# Patient Record
Sex: Female | Born: 1975 | Race: White | Hispanic: No | Marital: Married | Smoking: Former smoker
Health system: Southern US, Community
[De-identification: ages and names within clinical notes are randomized; demographics above are authoritative.]

## PROBLEM LIST (undated history)

## (undated) DIAGNOSIS — R011 Cardiac murmur, unspecified: Secondary | ICD-10-CM

## (undated) DIAGNOSIS — I10 Essential (primary) hypertension: Secondary | ICD-10-CM

## (undated) DIAGNOSIS — Z789 Other specified health status: Secondary | ICD-10-CM

## (undated) DIAGNOSIS — I350 Nonrheumatic aortic (valve) stenosis: Secondary | ICD-10-CM

## (undated) HISTORY — PX: KNEE SURGERY: SHX244

---
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
## 2006-09-25 ENCOUNTER — Emergency Department (HOSPITAL_COMMUNITY): Admission: EM | Admit: 2006-09-25 | Discharge: 2006-09-25 | Payer: Self-pay | Admitting: Emergency Medicine

## 2008-07-25 ENCOUNTER — Emergency Department (HOSPITAL_COMMUNITY): Admission: EM | Admit: 2008-07-25 | Discharge: 2008-07-25 | Payer: Self-pay | Admitting: Emergency Medicine

## 2008-11-03 ENCOUNTER — Emergency Department (HOSPITAL_COMMUNITY): Admission: EM | Admit: 2008-11-03 | Discharge: 2008-11-04 | Payer: Self-pay | Admitting: Emergency Medicine

## 2009-03-20 ENCOUNTER — Emergency Department (HOSPITAL_COMMUNITY): Admission: EM | Admit: 2009-03-20 | Discharge: 2009-03-20 | Payer: Self-pay | Admitting: Emergency Medicine

## 2009-05-02 ENCOUNTER — Emergency Department (HOSPITAL_COMMUNITY): Admission: EM | Admit: 2009-05-02 | Discharge: 2009-05-03 | Payer: Self-pay | Admitting: Emergency Medicine

## 2009-05-24 ENCOUNTER — Emergency Department (HOSPITAL_COMMUNITY): Admission: EM | Admit: 2009-05-24 | Discharge: 2009-05-24 | Payer: Self-pay | Admitting: Emergency Medicine

## 2009-10-27 ENCOUNTER — Emergency Department (HOSPITAL_COMMUNITY): Admission: EM | Admit: 2009-10-27 | Discharge: 2009-10-27 | Payer: Self-pay | Admitting: Emergency Medicine

## 2010-12-13 IMAGING — CR DG LUMBAR SPINE COMPLETE 4+V
5 series · 5 of 5 positions shown · non-contrast
Comparison: None.

CLINICAL DATA: Low back pain.  No known injury.  Urinary frequency.

LUMBAR SPINE - COMPLETE 4+ VIEW

[t l-spine a.p.]
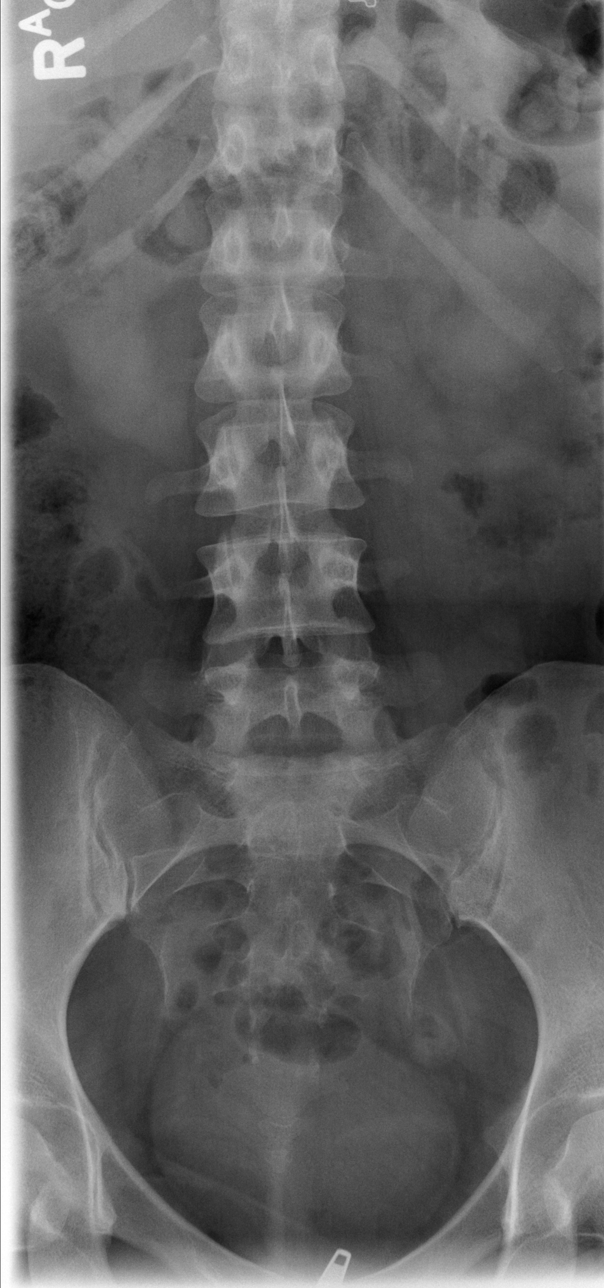

[t l-spine oblique exposure (1 of 2)]
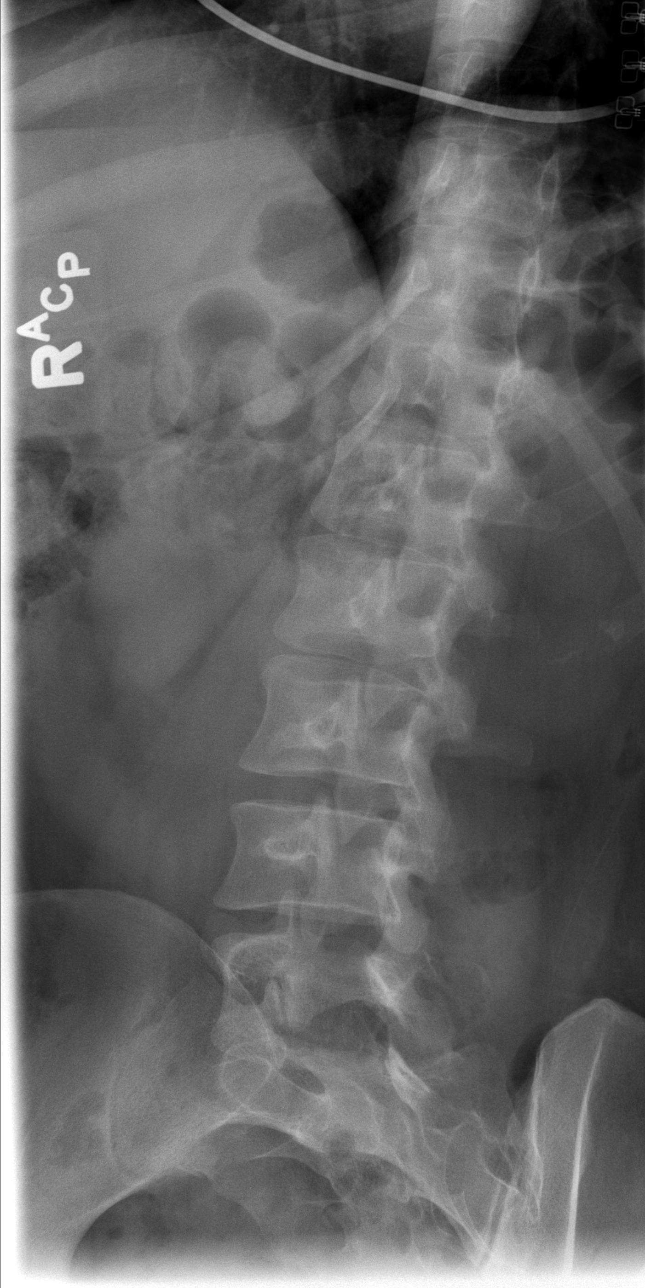

[t l-spine oblique exposure (2 of 2)]
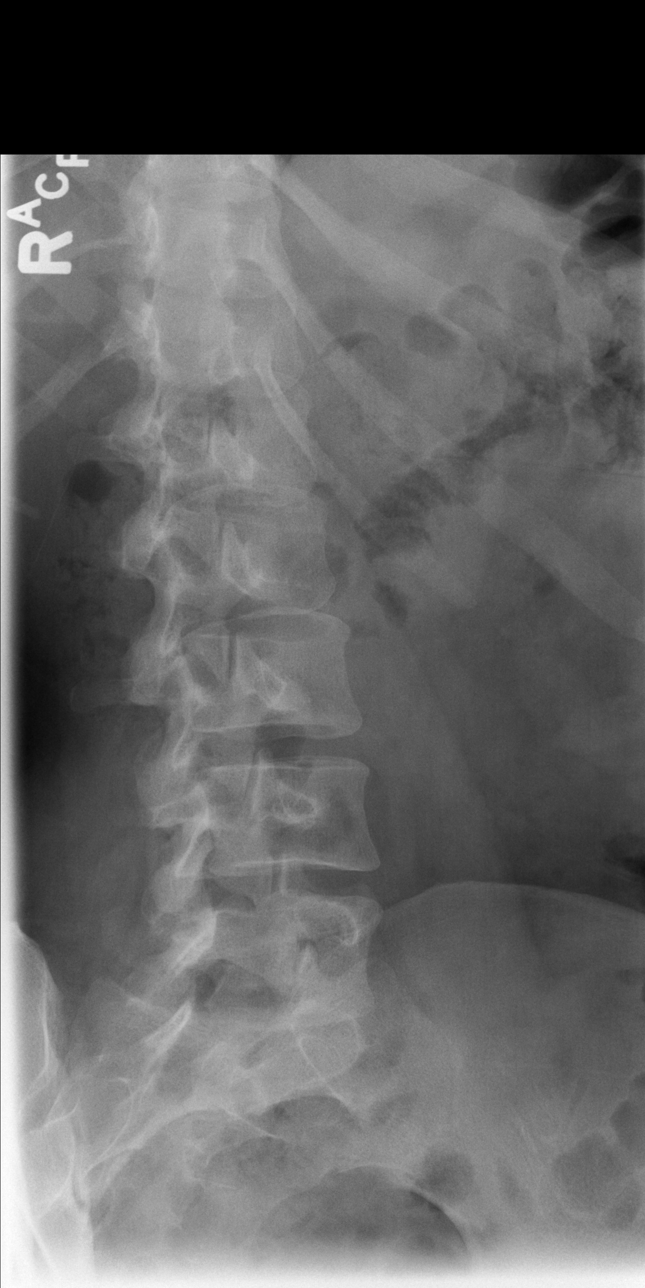

[t l-spine lat]
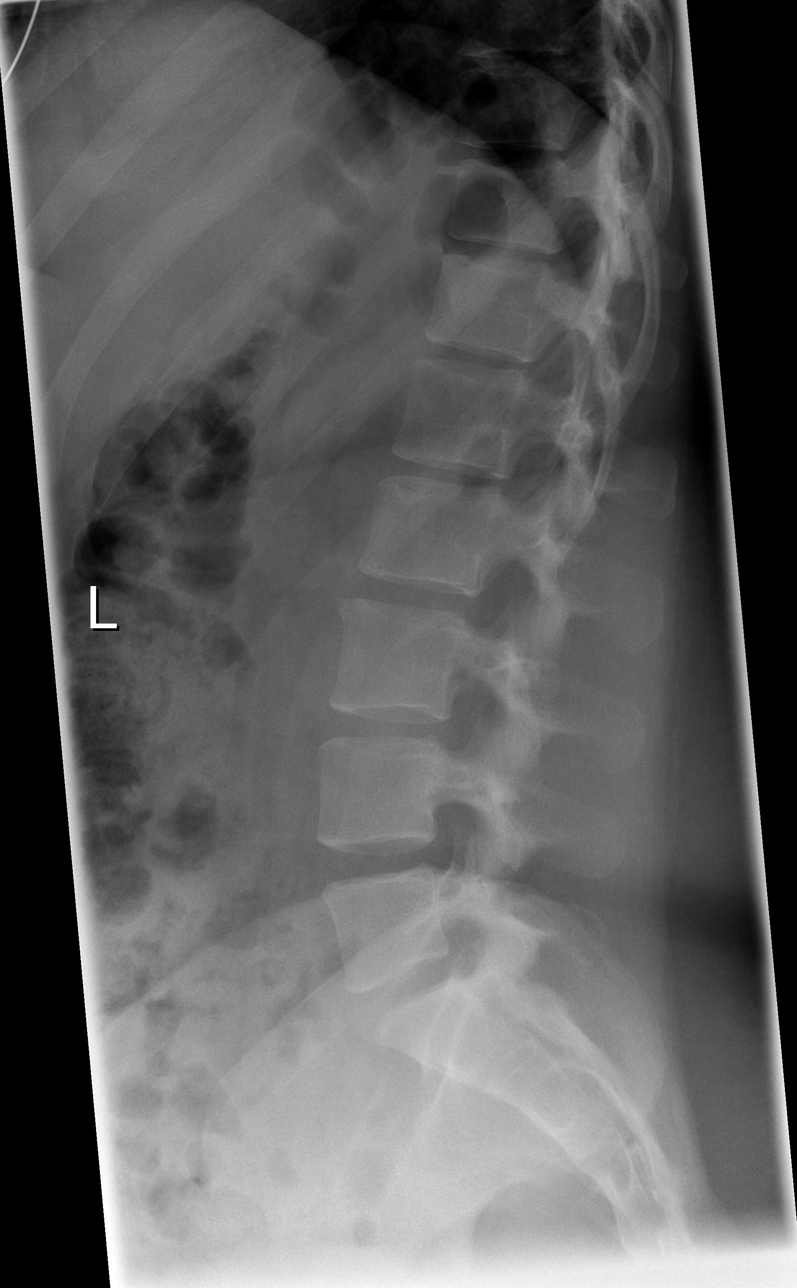

[t l-spine l5-s1 spot]
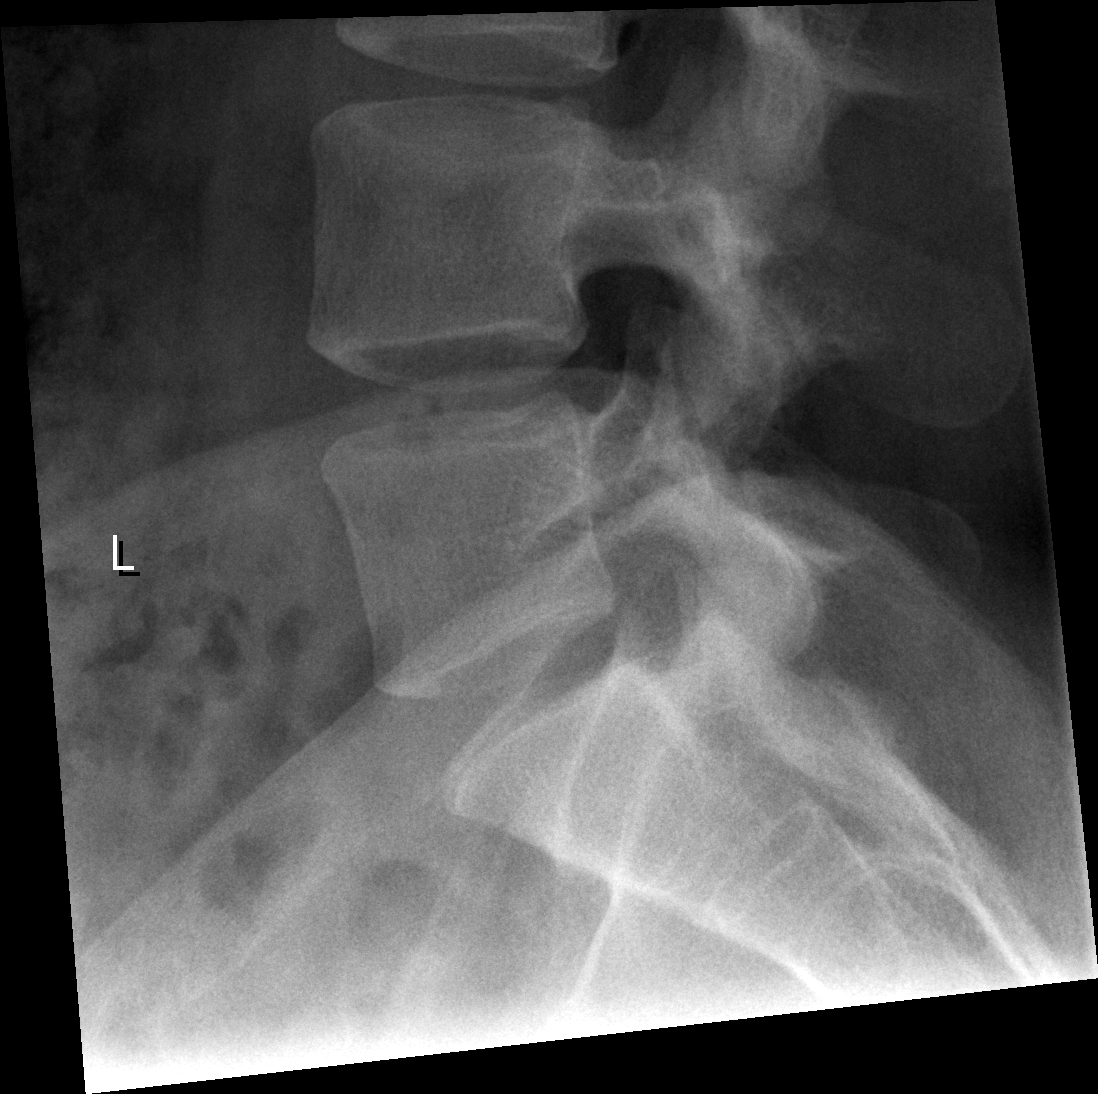

[5 of 5 positions shown; findings below may reference images not displayed]

FINDINGS: Five non-rib bearing lumbar vertebrae.  Minimal
dextroconvex lumbar scoliosis.  Mild anterior spur formation at the
L2-3 level.  No fractures, pars defects or subluxations.
IMPRESSION: Minimal scoliosis and mild degenerative changes.  No acute
abnormality.

## 2011-01-09 LAB — URINALYSIS, ROUTINE W REFLEX MICROSCOPIC
Nitrite: NEGATIVE
Protein, ur: NEGATIVE mg/dL
Specific Gravity, Urine: 1.024 (ref 1.005–1.030)
Urobilinogen, UA: 0.2 mg/dL (ref 0.0–1.0)

## 2011-01-09 LAB — POCT PREGNANCY, URINE

## 2011-01-30 LAB — URINALYSIS, ROUTINE W REFLEX MICROSCOPIC
Bilirubin Urine: NEGATIVE
Ketones, ur: NEGATIVE mg/dL
Nitrite: NEGATIVE
Protein, ur: NEGATIVE mg/dL
pH: 6.5 (ref 5.0–8.0)

## 2011-02-07 LAB — URINALYSIS, ROUTINE W REFLEX MICROSCOPIC
Glucose, UA: NEGATIVE mg/dL
Hgb urine dipstick: NEGATIVE
Ketones, ur: 15 mg/dL — AB
Nitrite: NEGATIVE
Protein, ur: NEGATIVE mg/dL
Specific Gravity, Urine: 1.029 (ref 1.005–1.030)
Urobilinogen, UA: 1 mg/dL (ref 0.0–1.0)
pH: 6 (ref 5.0–8.0)

## 2011-02-07 LAB — URINE MICROSCOPIC-ADD ON

## 2011-02-07 LAB — WET PREP, GENITAL
Trich, Wet Prep: NONE SEEN
Yeast Wet Prep HPF POC: NONE SEEN

## 2011-02-07 LAB — POCT PREGNANCY, URINE

## 2011-02-07 LAB — HCG, QUANTITATIVE, PREGNANCY

## 2011-02-07 LAB — GC/CHLAMYDIA PROBE AMP, GENITAL

## 2011-02-07 LAB — ABO/RH: ABO/RH(D): A POS

## 2011-07-26 LAB — RPR: RPR Ser Ql: NONREACTIVE

## 2011-07-26 LAB — URINALYSIS, ROUTINE W REFLEX MICROSCOPIC
Bilirubin Urine: NEGATIVE
Glucose, UA: NEGATIVE
Hgb urine dipstick: NEGATIVE
Ketones, ur: NEGATIVE
Nitrite: NEGATIVE
Specific Gravity, Urine: 1.01
pH: 6

## 2011-07-26 LAB — DIFFERENTIAL
Eosinophils Relative: 1
Lymphocytes Relative: 32
Lymphs Abs: 3.6
Monocytes Absolute: 0.5
Monocytes Relative: 4
Neutro Abs: 7

## 2011-07-26 LAB — CBC
HCT: 45.9
Hemoglobin: 15.7 — ABNORMAL HIGH
RBC: 5.15 — ABNORMAL HIGH

## 2011-07-26 LAB — POCT I-STAT, CHEM 8
Glucose, Bld: 95
HCT: 47 — ABNORMAL HIGH
Hemoglobin: 16 — ABNORMAL HIGH
Potassium: 3.8
Sodium: 141

## 2011-07-26 LAB — WET PREP, GENITAL
Clue Cells Wet Prep HPF POC: NONE SEEN
Yeast Wet Prep HPF POC: NONE SEEN

## 2011-07-26 LAB — GC/CHLAMYDIA PROBE AMP, GENITAL

## 2012-01-28 HISTORY — PX: TUBAL LIGATION: SHX77

## 2022-11-01 ENCOUNTER — Other Ambulatory Visit: Payer: Self-pay

## 2022-11-01 ENCOUNTER — Encounter (HOSPITAL_COMMUNITY): Payer: Self-pay

## 2022-11-01 ENCOUNTER — Emergency Department (HOSPITAL_COMMUNITY)
Admission: EM | Admit: 2022-11-01 | Discharge: 2022-11-01 | Disposition: A | Discharge status: Home / Self Care | Payer: Commercial Managed Care - PPO | Attending: Emergency Medicine | Admitting: Emergency Medicine

## 2022-11-01 ENCOUNTER — Emergency Department (HOSPITAL_COMMUNITY): Payer: Commercial Managed Care - PPO

## 2022-11-01 DIAGNOSIS — Z8616 Personal history of COVID-19: Secondary | ICD-10-CM | POA: Diagnosis not present

## 2022-11-01 DIAGNOSIS — R55 Syncope and collapse: Secondary | ICD-10-CM | POA: Diagnosis not present

## 2022-11-01 DIAGNOSIS — R011 Cardiac murmur, unspecified: Secondary | ICD-10-CM | POA: Diagnosis not present

## 2022-11-01 HISTORY — DX: Other specified health status: Z78.9

## 2022-11-01 LAB — URINALYSIS, ROUTINE W REFLEX MICROSCOPIC
Bilirubin Urine: NEGATIVE
Glucose, UA: NEGATIVE mg/dL
Ketones, ur: NEGATIVE mg/dL
Nitrite: NEGATIVE
Protein, ur: 100 mg/dL — AB
Specific Gravity, Urine: 1.024 (ref 1.005–1.030)
pH: 7 (ref 5.0–8.0)

## 2022-11-01 LAB — CBC
HCT: 35.6 % — ABNORMAL LOW (ref 36.0–46.0)
Hemoglobin: 12.4 g/dL (ref 12.0–15.0)
MCH: 30 pg (ref 26.0–34.0)
MCHC: 34.8 g/dL (ref 30.0–36.0)
MCV: 86.2 fL (ref 80.0–100.0)
Platelets: 304 10*3/uL (ref 150–400)
RBC: 4.13 MIL/uL (ref 3.87–5.11)
RDW: 12.3 % (ref 11.5–15.5)
WBC: 9.9 10*3/uL (ref 4.0–10.5)
nRBC: 0 % (ref 0.0–0.2)

## 2022-11-01 LAB — COMPREHENSIVE METABOLIC PANEL
ALT: 17 U/L (ref 0–44)
AST: 19 U/L (ref 15–41)
Albumin: 3.1 g/dL — ABNORMAL LOW (ref 3.5–5.0)
Alkaline Phosphatase: 52 U/L (ref 38–126)
Anion gap: 7 (ref 5–15)
BUN: 14 mg/dL (ref 6–20)
CO2: 25 mmol/L (ref 22–32)
Calcium: 8.2 mg/dL — ABNORMAL LOW (ref 8.9–10.3)
Chloride: 104 mmol/L (ref 98–111)
Creatinine, Ser: 0.94 mg/dL (ref 0.44–1.00)
GFR, Estimated: >60 mL/min (ref >60)
Glucose, Bld: 116 mg/dL — ABNORMAL HIGH (ref 70–99)
Potassium: 3.6 mmol/L (ref 3.5–5.1)
Sodium: 136 mmol/L (ref 135–145)
Total Bilirubin: 0.6 mg/dL (ref 0.3–1.2)
Total Protein: 6 g/dL — ABNORMAL LOW (ref 6.5–8.1)

## 2022-11-01 LAB — PREGNANCY, URINE: Preg Test, Ur: NEGATIVE

## 2022-11-01 LAB — LIPASE, BLOOD: Lipase: 37 U/L (ref 11–51)

## 2022-11-01 MED ORDER — LACTATED RINGERS IV BOLUS
1000.0000 mL | Freq: Once | INTRAVENOUS | Status: AC
  Administered 2022-11-01: 1000 mL via INTRAVENOUS

## 2022-11-01 NOTE — ED Triage Notes (Signed)
"  Went to work and was feeling okay, but suddenly felt hot and dizzy. I asked for water and sat down. Next thing I knew I was hearing them calling EMS and I was diaphoretic and nauseated" per pt   "Bystanders said she slumped with head in hands for a few seconds. She did vomit in route to hospital. We gave zofran 4mg " per EMS

## 2022-11-01 NOTE — ED Provider Notes (Signed)
[facility] EMERGENCY DEPARTMENT Provider Note   CSN: [id number] Arrival date & time: 11/01/22  1715     History  Chief Complaint  Patient presents with   Loss of Consciousness    Brianna Turner is a 47 y.o. female.  Is a 47 year old female with no significant past medical history presenting to the emergency department with syncope.  The patient states that she was in her normal state of health today when she went to work this evening.  She states that shortly after arriving to work she became very hot and sweaty and felt lightheaded.  She states that she went to lean over on a counter and next thing she knew she woke up on the ground.  She states that she does not believe she hit her head and denies any pain or injuries from the syncopal event.  She denied any chest pain or shortness of breath prior to passing out but states that she did vomit again after passing out.  She denies any fevers or chills, cough or congestion, diarrhea.  She states that she has been eating and drinking normally.  Of note she states that she did test positive for COVID 2 weeks ago and was told at urgent care that she had a heart murmur that she has not yet had evaluated.  She states that her COVID symptoms have now resolved.  The history is provided by the patient.  Loss of Consciousness      Home Medications Prior to Admission medications   Not on File      Allergies    Naproxen    Review of Systems   Review of Systems  Cardiovascular:  Positive for syncope.    Physical Exam Updated Vital Signs BP 103/72 (BP Location: Right Arm)   Pulse 80   Temp 97.9 F (36.6 C) (Oral)   Resp 16   Ht 5\' 5"  (1.651 m)   Wt 97.5 kg   SpO2 96%   BMI 35.78 kg/m  Physical Exam Vitals and nursing note reviewed.  Constitutional:      General: She is not in acute distress.    Appearance: Normal appearance.  HENT:     Head: Normocephalic and atraumatic.     Nose: Nose normal.      Mouth/Throat:     Mouth: Mucous membranes are moist.     Pharynx: Oropharynx is clear.  Eyes:     Extraocular Movements: Extraocular movements intact.     Conjunctiva/sclera: Conjunctivae normal.     Pupils: Pupils are equal, round, and reactive to light.  Cardiovascular:     Rate and Rhythm: Normal rate and regular rhythm.     Heart sounds: Murmur (systolic) heard.  Pulmonary:     Effort: Pulmonary effort is normal.     Breath sounds: Normal breath sounds.  Abdominal:     General: Abdomen is flat.     Palpations: Abdomen is soft.     Tenderness: There is no abdominal tenderness.  Musculoskeletal:        General: Normal range of motion.     Cervical back: Normal range of motion and neck supple.     Right lower leg: No edema.     Left lower leg: No edema.  Skin:    General: Skin is warm and dry.  Neurological:     General: No focal deficit present.     Mental Status: She is alert and oriented to person, place, and time.  Cranial Nerves: No cranial nerve deficit.     Sensory: No sensory deficit.     Motor: No weakness.  Psychiatric:        Mood and Affect: Mood normal.        Behavior: Behavior normal.     ED Results / Procedures / Treatments   Labs (all labs ordered are listed, but only abnormal results are displayed) Labs Reviewed  CBC - Abnormal; Notable for the following components:      Result Value   HCT 35.6 (*)    All other components within normal limits  URINALYSIS, ROUTINE W REFLEX MICROSCOPIC - Abnormal; Notable for the following components:   APPearance HAZY (*)    Hgb urine dipstick MODERATE (*)    Protein, ur 100 (*)    Leukocytes,Ua TRACE (*)    Bacteria, UA FEW (*)    All other components within normal limits  COMPREHENSIVE METABOLIC PANEL - Abnormal; Notable for the following components:   Glucose, Bld 116 (*)    Calcium 8.2 (*)    Total Protein 6.0 (*)    Albumin 3.1 (*)    All other components within normal limits  PREGNANCY, URINE   LIPASE, BLOOD    EKG EKG Interpretation  Date/Time:  Tuesday November 01 2022 17:47:58 EST Ventricular Rate:  77 PR Interval:  190 QRS Duration: 83 QT Interval:  408 QTC Calculation: 462 R Axis:   16 Text Interpretation: Sinus rhythm Prominent P waves, nondiagnostic Nonspecific T abnormalities, lateral leads No previous ECGs available Confirmed by Leanord Asal ([id number]) on 11/01/2022 6:30:07 PM  Radiology DG Chest 1 View  Result Date: 11/01/2022 CLINICAL DATA:  Syncope EXAM: CHEST  1 VIEW COMPARISON:  None Available. FINDINGS: There is likely some mild atherosclerotic calcification of the aortic arch. Cardiac and mediastinal margins appear normal. The lungs appear clear. No blunting of the costophrenic angles. IMPRESSION: 1. No acute findings. 2. Mild atherosclerotic calcification of the aortic arch. Electronically Signed   By: Van Clines M.D.   On: 11/01/2022 19:51    Procedures Procedures    Medications Ordered in ED Medications  lactated ringers bolus 1,000 mL (0 mLs Intravenous Stopped 11/01/22 2058)    ED Course/ Medical Decision Making/ A&P Clinical Course as of 11/01/22 2123  Tue Nov 01, 2022  2036 Few bacteria and leuks in urine however does have squams and no urinary symptoms making UTI unlikely. Remainder of work up reassuring. Cardiology will be consulted for recommendations on her new murmur in the setting of syncope. [VK]  2046 I spoke with Dr. Einar Gip of cardiology who states the patient is appropriate for discharge with outpatient follow up. He will assist with setting her up and outpatient visit. [VK]    Clinical Course User Index [VK] Kemper Durie, DO                           Medical Decision Making This patient presents to the ED with chief complaint(s) of syncope with no pertinent past medical history which further complicates the presenting complaint. The complaint involves an extensive differential diagnosis and also carries with it a high  risk of complications and morbidity.    The differential diagnosis includes ACS, arrhythmia, aortic stenosis, dehydration, electrolyte abnormality, pregnancy, orthostatic hypotension, vasovagal syncope, patient has no headache or focal neurologic deficits making ICH or mass effect unlikely  Additional history obtained: Additional history obtained from N/A Records reviewed urgent care records  ED  Course and Reassessment: Upon patient's arrival to the emergency department she had soft BPs but was otherwise hemodynamically stable.  She will be started on a liter of fluids and will have labs performed.  EKG on arrival showed normal sinus rhythm without acute ischemic changes.  She will additionally have urine performed to evaluate for possible pregnancy as a cause of her syncope.  She does have a new murmur and cardiology will likely be consulted for recommendations on follow-up and disposition.  Independent labs interpretation:  The following labs were independently interpreted: within normal range  Independent visualization of imaging: - I independently visualized the following imaging with scope of interpretation limited to determining acute life threatening conditions related to emergency care: CXR, which revealed no acute disease  Consultation: - Consulted or discussed management/test interpretation w/ external professional: cardiology  Consideration for admission or further workup: Patient has no emergent conditions requiring admission or further work-up at this time and is stable for discharge home with cardiology follow-up  Social Determinants of health: N/A    Amount and/or Complexity of Data Reviewed Labs: ordered. Radiology: ordered.          Final Clinical Impression(s) / ED Diagnoses Final diagnoses:  Syncope, unspecified syncope type  Heart murmur, systolic    Rx / DC Orders ED Discharge Orders          Ordered    Ambulatory referral to Cardiology        Comments: If you have not heard from the Cardiology office within the next 72 hours please call [phone].   11/01/22 2121              Kemper Durie, DO 11/01/22 2123

## 2022-11-01 NOTE — Discharge Instructions (Signed)
You were seen in the emergency department for your episode of passing out.  You had no signs of abnormal heart rhythm and did not appear severely dehydrated on your workup here today.  You do have a murmur and sometimes that can be indicative of heart disease that could make you pass out, so you should follow-up with cardiology to have this further evaluated.  They should be calling you with an appointment, however if you do not hear from them in the next 2 days I would give them a call to determine when your follow-up appointment is.  You should return to the emergency department if you are having recurrent episodes of passing out, severe chest pain or shortness of breath prior to passing out, you injure yourself after passing out or if you have any other new or concerning symptoms.

## 2022-11-10 DIAGNOSIS — R55 Syncope and collapse: Secondary | ICD-10-CM | POA: Insufficient documentation

## 2022-11-10 NOTE — Progress Notes (Deleted)
    Patient referred by No ref. provider found for syncope  Subjective:   Brianna Turner, female    DOB: 1976-01-14, 47 y.o.   MRN: [id number]  *** No chief complaint on file.   *** HPI  47 y.o. *** female with ***  ***The patient states that she was in her normal state of health today when she went to work this evening. She states that shortly after arriving to work she became very hot and sweaty and felt lightheaded. She states that she went to lean over on a counter and next thing she knew she woke up on the ground. She states that she does not believe she hit her head and denies any pain or injuries from the syncopal event. She denied any chest pain or shortness of breath prior to passing out but states that she did vomit again after passing out. She denies any fevers or chills, cough or congestion, diarrhea. She states that she has been eating and drinking normally. Of note she states that she did test positive for COVID 2 weeks ago and was told at urgent care that she had a heart murmur that she has not yet had evaluated. She states that her COVID symptoms have now resolved.   *** Past Medical History:  Diagnosis Date   Known health problems: none     *** Past Surgical History:  Procedure Laterality Date   CESAREAN SECTION      *** Social History   Tobacco Use  Smoking Status Every Day   Packs/day: 0.50   Years: 20.00   Total pack years: 10.00   Types: Cigarettes  Smokeless Tobacco Never    Social History   Substance and Sexual Activity  Alcohol Use Not Currently    *** No family history on file.  *** No current outpatient medications on file.   Cardiovascular and other pertinent studies:  Reviewed external labs and tests, independently interpreted  *** EKG ***/***/202***: ***  ***  *** Recent labs: 11/01/2022: Glucose 116, BUN/Cr 14/0.94. EGFR >60. Na/K 136/3.6. Rest of the CMP normal H/H 12/35. MCV 86. Platelets 304    *** ROS       *** There were no vitals filed for this visit.   There is no height or weight on file to calculate BMI. There were no vitals filed for this visit.  *** Objective:   Physical Exam    ***     Visit diagnoses: No diagnosis found.   No orders of the defined types were placed in this encounter.    Medication changes this visit: There are no discontinued medications.  No orders of the defined types were placed in this encounter.    Assessment & Recommendations:   ***  ***  Thank you for referring the patient to [place]. Please feel free to contact with any questions.   Nigel Mormon, MD Pager: [phone] Office: [phone]

## 2022-11-11 ENCOUNTER — Encounter: Payer: Commercial Managed Care - PPO | Admitting: Cardiology

## 2022-11-18 ENCOUNTER — Encounter: Payer: Self-pay | Admitting: Cardiology

## 2022-11-18 ENCOUNTER — Ambulatory Visit: Payer: Commercial Managed Care - PPO | Admitting: Cardiology

## 2022-11-18 VITALS — BP 126/96 | HR 96 | Resp 16 | Ht 65.0 in | Wt 216.0 lb

## 2022-11-18 DIAGNOSIS — R011 Cardiac murmur, unspecified: Secondary | ICD-10-CM | POA: Insufficient documentation

## 2022-11-18 DIAGNOSIS — R55 Syncope and collapse: Secondary | ICD-10-CM

## 2022-11-18 MED ORDER — NICOTINE 7 MG/24HR TD PT24: 7.0000 mg | MEDICATED_PATCH | Freq: Every day | TRANSDERMAL | 0 refills | Status: DC

## 2022-11-18 NOTE — Progress Notes (Signed)
Subjective:   Brianna Turner, female    DOB: 05/14/1976, 47 y.o.   MRN: [id number]   Chief Complaint  Patient presents with   Loss of Consciousness   New Patient (Initial Visit)         HPI  47 y.o. Caucasian female with syncope  Patient works as an Radio broadcast assistant at [employer].  2 weeks ago, patient went to work, parked her car and ran to her store given that it was raining.  As she entered the building, she felt hot, lightheaded, sat down, followed by complete loss of consciousness for at least 1 minute.  She reportedly saw the CCTV footage and was told that her fingers and toes were turning blue.  Her coworkers called 911, who had asked to get a ED by patient's side.  Before that could happen, she regained consciousness.  She then went to [facility], ER.  She was reportedly mildly hypotensive on arrival there, but was better after IV fluids.  She was found to have murmur on exam.  She was thus referred for cardiology evaluation.  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  She does not recall having been like this in the recent past.  She denies any chest pain, leg edema, orthopnea.  She reports mild dyspnea with climbing up stairs.  Prior to her syncope episode, she did not drink any water regularly.  She does drink 3-4 cups of coffee, which she has now switched partly to decaf.  She rarely drinks alcohol.  She smokes half pack a day for last 30 years.  Patient has family history of cardiac events.  Mother's brother died at age 64, presumed to be heart attack, but no autopsy was performed to patient's knowledge.  Mother sister also had "heart attack" at age 54, but details are not known.  Separately, this brother has had 5 stents, and mother's mother has had pacemaker.  Patient has no siblings, has 2 children aged 89 and 5.   Past Medical History:  Diagnosis Date   Known health problems: none      Past Surgical History:  Procedure  Laterality Date   CESAREAN SECTION     KNEE SURGERY       Social History   Tobacco Use  Smoking Status Every Day   Packs/day: 0.50   Years: 20.00   Total pack years: 10.00   Types: Cigarettes  Smokeless Tobacco Never    Social History   Substance and Sexual Activity  Alcohol Use Not Currently   Comment: rare     Family History  Problem Relation Age of Onset   Heart disease Mother    Heart disease Maternal Grandmother    Heart attack Maternal Grandfather    Heart attack Paternal Grandfather      No current outpatient medications on file.   Cardiovascular and other pertinent studies:  Reviewed external labs and tests, independently interpreted  EKG 11/18/2022: Sinus rhythm 84 bpm  Left ventricular hypertrophy Left atrial enlargement. ST-T changes likely due to LVH   CXR 11/01/2022: 1. No acute findings. 2. Mild atherosclerotic calcification of the aortic arch.     Recent labs: 11/01/2022: Glucose 116, BUN/Cr 14/0.94. EGFR >60. Na/K 136/3.6. Rest of the CMP normal H/H 12/35. MCV 86. Platelets 304   Review of Systems  Cardiovascular:  Positive for dyspnea on exertion and syncope. Negative for chest pain, leg swelling and palpitations.  Vitals:   11/18/22 0831 11/18/22 0841  BP: (!) 126/96   Pulse: 96   Resp: 16   SpO2: 98% 96%     Body mass index is 35.94 kg/m. Filed Weights   11/18/22 0831  Weight: 216 lb (98 kg)     Objective:   Physical Exam Vitals and nursing note reviewed.  Constitutional:      General: She is not in acute distress. Neck:     Vascular: No JVD.  Cardiovascular:     Rate and Rhythm: Normal rate and regular rhythm.     Heart sounds: Murmur heard.     Harsh holosystolic murmur is present with a grade of 4/6 at the upper right sternal border radiating to the neck. Murmur increased with Valsalva  Pulmonary:     Effort: Pulmonary effort is normal.     Breath sounds: Normal breath sounds. No wheezing or  rales.  Musculoskeletal:     Right lower leg: No edema.     Left lower leg: No edema.         Visit diagnoses:   ICD-10-CM   1. Syncope and collapse  R55 EKG 12-Lead    PCV ECHOCARDIOGRAM COMPLETE    PCV CARDIAC STRESS TEST    PCV CAROTID DUPLEX (BILATERAL)    2. Murmur  R01.1 PCV ECHOCARDIOGRAM COMPLETE    PCV CARDIAC STRESS TEST       Orders Placed This Encounter  Procedures   EKG 12-Lead        Assessment & Recommendations:   47 y.o. Caucasian female with syncope  Syncope: Exertional syncope, LVH and strain pattern on EKG without known hypertension, and physical exam with harsh holosystolic murmur worse with Valsalva, all point towards most likely diagnosis of hypertrophic cardiomyopathy.  She has had at least 1 family member with sudden death at age 25, which was previously presumed to be due to heart attack. I recommend echocardiogram. Encourage increased hydration. Added metoprolol succinate 25 mg daily.  I will perform exercise treadmill stress test for risk stratification (to assess for any exercise induced hypotension or arrhythmia) after starting metoprolol succinate.  Depending on echocardiogram confirmation of hypertrophic cardiomyopathy, she will likely need genetic testing.   Nicotine dependence: Tobacco cessation counseling:  - Currently smoking 1/2 packs/day   - Patient was informed of the dangers of tobacco abuse including stroke, cancer, and MI, as well as benefits of tobacco cessation. - Patient is willing to quit at this time. - Approximately 5 mins were spent counseling patient cessation techniques. We discussed various methods to help quit smoking, including deciding on a date to quit, joining a support group, pharmacological agents. Patient would like to use nicotine patch. - I will reassess her progress at the next follow-up visit   Thank you for referring the patient to [place]. Please feel free to contact with any questions.   Nigel Mormon, MD Pager: [phone] Office: [phone]

## 2022-11-22 ENCOUNTER — Ambulatory Visit: Payer: Commercial Managed Care - PPO

## 2022-11-22 DIAGNOSIS — R011 Cardiac murmur, unspecified: Secondary | ICD-10-CM

## 2022-11-22 DIAGNOSIS — R55 Syncope and collapse: Secondary | ICD-10-CM

## 2022-12-01 ENCOUNTER — Ambulatory Visit: Payer: Commercial Managed Care - PPO | Admitting: Cardiology

## 2022-12-01 ENCOUNTER — Encounter: Payer: Self-pay | Admitting: Cardiology

## 2022-12-01 VITALS — BP 110/73 | HR 83 | Resp 16 | Ht 65.0 in | Wt 218.6 lb

## 2022-12-01 DIAGNOSIS — R011 Cardiac murmur, unspecified: Secondary | ICD-10-CM

## 2022-12-01 DIAGNOSIS — R55 Syncope and collapse: Secondary | ICD-10-CM

## 2022-12-01 MED ORDER — METOPROLOL SUCCINATE ER 25 MG PO TB24: 25.0000 mg | ORAL_TABLET | Freq: Every day | ORAL | 2 refills | Status: DC

## 2022-12-01 NOTE — Progress Notes (Signed)
Subjective:   Brianna Turner, female    DOB: [birth year], 47 y.o.   MRN: [id number]   Chief Complaint  Patient presents with   Results    Echocardiogram     HPI  47 y.o. Caucasian female with syncope, suspected HOCM  Patient has not had any recurrent syncope episodes.  She has not started using nicotine patch yet.  It appears that I will not prescribe metoprolol at last visit, although we had discussed it.  Recent echocardiogram results reviewed with the patient, details below.  Initial consultation visit 10/2022: Patient works as an Radio broadcast assistant at [employer].  2 weeks ago, patient went to work, parked her car and ran to her store given that it was raining.  As she entered the building, she felt hot, lightheaded, sat down, followed by complete loss of consciousness for at least 1 minute.  She reportedly saw the CCTV footage and was told that her fingers and toes were turning blue.  Her coworkers called 911, who had asked to get a ED by patient's side.  Before that could happen, she regained consciousness.  She then went to [facility], ER.  She was reportedly mildly hypotensive on arrival there, but was better after IV fluids.  She was found to have murmur on exam.  She was thus referred for cardiology evaluation.  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  She does not recall having been like this in the recent past.  She denies any chest pain, leg edema, orthopnea.  She reports mild dyspnea with climbing up stairs.  Prior to her syncope episode, she did not drink any water regularly.  She does drink 3-4 cups of coffee, which she has now switched partly to decaf.  She rarely drinks alcohol.  She smokes half pack a day for last 30 years.  Patient has family history of cardiac events.  Mother's brother died at age 67, presumed to be heart attack, but no autopsy was performed to patient's knowledge.  Mother sister also had "heart attack" at age 47,  but details are not known.  Separately, this brother has had 5 stents, and mother's mother has had pacemaker.  Patient has no siblings, has 2 children aged 59 and 101.   Current Outpatient Medications:    nicotine (NICODERM CQ - DOSED IN MG/24 HR) 7 mg/24hr patch, Place 1 patch (7 mg total) onto the skin daily. (Patient not taking: Reported on 12/01/2022), Disp: 28 patch, Rfl: 0   Cardiovascular and other pertinent studies:  Reviewed external labs and tests, independently interpreted  Echocardiogram 11/22/2022:  Normal LV systolic function with visual EF 55-60%. Left ventricle cavity  is normal in size. Mild concentric hypertrophy of the left ventricle.  Normal global wall motion. Normal diastolic filling pattern, normal LAP.  Calculated EF 69%.  Structurally normal tricuspid valve.  Mild tricuspid regurgitation. No  evidence of pulmonary hypertension.  No prior available for comparison.  I personally reviewed the echocardiogram.  Endocardial definition is not clear, therefore LVH assessment is limited.  In addition, LV outflow tract gradient is not adequately assessed, but at least 23 mmHg.  EKG 11/18/2022: Sinus rhythm 84 bpm  Left ventricular hypertrophy Left atrial enlargement. ST-T changes likely due to LVH   CXR 11/01/2022: 1. No acute findings. 2. Mild atherosclerotic calcification of the aortic arch.     Recent labs: 11/01/2022: Glucose 116, BUN/Cr 14/0.94. EGFR >60. Na/K 136/3.6. Rest of the CMP normal H/H  12/35. MCV 86. Platelets 304   Review of Systems  Cardiovascular:  Positive for dyspnea on exertion and syncope. Negative for chest pain, leg swelling and palpitations.         Vitals:   12/01/22 1104  BP: 110/73  Pulse: 83  Resp: 16  SpO2: 97%     Body mass index is 36.38 kg/m. Filed Weights   12/01/22 1104  Weight: 218 lb 9.6 oz (99.2 kg)     Objective:   Physical Exam Vitals and nursing note reviewed.  Constitutional:      General: She is not  in acute distress. Neck:     Vascular: No JVD.  Cardiovascular:     Rate and Rhythm: Normal rate and regular rhythm.     Heart sounds: Murmur heard.     Harsh holosystolic murmur is present with a grade of 4/6 at the upper right sternal border radiating to the neck. Murmur increased with Valsalva  Pulmonary:     Effort: Pulmonary effort is normal.     Breath sounds: Normal breath sounds. No wheezing or rales.  Musculoskeletal:     Right lower leg: No edema.     Left lower leg: No edema.          Visit diagnoses:   ICD-10-CM   1. Murmur  R01.1 ECHOCARDIOGRAM COMPLETE    MR CARDIAC MORPHOLOGY W WO CONTRAST    2. Syncope and collapse  R55 ECHOCARDIOGRAM COMPLETE    MR CARDIAC MORPHOLOGY W WO CONTRAST       Orders Placed This Encounter  Procedures   MR CARDIAC MORPHOLOGY W WO CONTRAST   ECHOCARDIOGRAM COMPLETE        Assessment & Recommendations:   47 y.o. Caucasian female with syncope, suspected HOCM  Suspected HCM, syncope: Exertional syncope, LVH and strain pattern on EKG without known hypertension, and physical exam with harsh holosystolic murmur worse with Valsalva, all point towards most likely diagnosis of hypertrophic cardiomyopathy.  She has had at least 1 family member with sudden death at age 27, which was previously presumed to be due to heart attack. Encourage increased hydration. Added metoprolol succinate 25 mg daily.   Patient outpatient echocardiogram likely underestimated LVH as well as LVEF.  My clinical suspicion for HOCM is very high.  I will repeat an echocardiogram with contrast use, as well as obtain cardiac MRI.  Following echocardiogram, I will perform exercise treadmill stress test for risk stratification (to assess for any exercise induced hypotension or arrhythmia) while on starting metoprolol succinate.  Depending on echocardiogram confirmation of hypertrophic cardiomyopathy, she will likely need genetic testing.   Nicotine  dependence: Emphasized importance of nicotine cessation  Thank you for referring the patient to [place]. Please feel free to contact with any questions.   Nigel Mormon, MD Pager: [phone] Office: [phone]

## 2022-12-08 ENCOUNTER — Ambulatory Visit (HOSPITAL_COMMUNITY)
Admission: RE | Admit: 2022-12-08 | Discharge: 2022-12-08 | Disposition: A | Discharge status: Home / Self Care | Payer: Commercial Managed Care - PPO | Source: Ambulatory Visit | Attending: Cardiology | Admitting: Cardiology

## 2022-12-08 DIAGNOSIS — R011 Cardiac murmur, unspecified: Secondary | ICD-10-CM | POA: Insufficient documentation

## 2022-12-08 DIAGNOSIS — R55 Syncope and collapse: Secondary | ICD-10-CM | POA: Diagnosis not present

## 2022-12-08 MED ORDER — PERFLUTREN LIPID MICROSPHERE
1.0000 mL | INTRAVENOUS | Status: AC | PRN
  Administered 2022-12-08: 4 mL via INTRAVENOUS
  Filled 2022-12-08: qty 10

## 2022-12-08 NOTE — Progress Notes (Signed)
Echocardiogram 2D Echocardiogram has been performed.  Fidel Levy 12/08/2022, 3:02 PM

## 2022-12-09 LAB — ECHOCARDIOGRAM COMPLETE
AR max vel: 0.63 cm2
AV Area VTI: 0.57 cm2
AV Area mean vel: 0.55 cm2
AV Mean grad: 88.7 mmHg
AV Peak grad: 138.8 mmHg
Ao pk vel: 5.89 m/s
Area-P 1/2: 4.15 cm2
Calc EF: 64.3 %
S' Lateral: 2.6 cm
Single Plane A2C EF: 64 %
Single Plane A4C EF: 64.9 %

## 2022-12-09 NOTE — Progress Notes (Signed)
Please cancel stress test and arrange for an office visit at next available with me.   Thanks MJP

## 2023-01-12 ENCOUNTER — Encounter: Payer: Self-pay | Admitting: Cardiology

## 2023-01-12 ENCOUNTER — Ambulatory Visit: Payer: Commercial Managed Care - PPO | Admitting: Cardiology

## 2023-01-12 VITALS — BP 109/80 | HR 90 | Ht 65.0 in | Wt 218.0 lb

## 2023-01-12 DIAGNOSIS — I421 Obstructive hypertrophic cardiomyopathy: Secondary | ICD-10-CM

## 2023-01-12 DIAGNOSIS — R55 Syncope and collapse: Secondary | ICD-10-CM

## 2023-01-12 MED ORDER — METOPROLOL SUCCINATE ER 50 MG PO TB24: 50.0000 mg | ORAL_TABLET | Freq: Every day | ORAL | 3 refills | Status: DC

## 2023-01-12 NOTE — Progress Notes (Signed)
Subjective:   Brianna Turner, female    DOB: [birth year], 47 y.o.   MRN: [id number]   Chief Complaint  Patient presents with   Heart Murmur   Follow-up    HPI  47 y.o. Caucasian female with hypertrophic obstructive cardiomyopathy, exertional syncope  Patient has had episodes of lightheadedness, but has not had any recurrent syncope since last visit. She has increased her fluid intake, has been avoiding any physical exertion.   Initial consultation visit 10/2022: Patient works as an Radio broadcast assistant at [employer].  2 weeks ago, patient went to work, parked her car and ran to her store given that it was raining.  As she entered the building, she felt hot, lightheaded, sat down, followed by complete loss of consciousness for at least 1 minute.  She reportedly saw the CCTV footage and was told that her fingers and toes were turning blue.  Her coworkers called 911, who had asked to get a ED by patient's side.  Before that could happen, she regained consciousness.  She then went to [facility], ER.  She was reportedly mildly hypotensive on arrival there, but was better after IV fluids.  She was found to have murmur on exam.  She was thus referred for cardiology evaluation.  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  She does not recall having been like this in the recent past.  She denies any chest pain, leg edema, orthopnea.  She reports mild dyspnea with climbing up stairs.  Prior to her syncope episode, she did not drink any water regularly.  She does drink 3-4 cups of coffee, which she has now switched partly to decaf.  She rarely drinks alcohol.  She smokes half pack a day for last 30 years.  Patient has family history of cardiac events.  Mother's brother died at age 25, presumed to be heart attack, but no autopsy was performed to patient's knowledge.  Mother sister also had "heart attack" at age 22, but details are not known.  Separately, this brother  has had 5 stents, and mother's mother has had pacemaker.  Patient has no siblings, has 2 children aged 47 and 36.   Current Outpatient Medications:    metoprolol succinate (TOPROL-XL) 25 MG 24 hr tablet, Take 1 tablet (25 mg total) by mouth daily. Take with or immediately following a meal., Disp: 30 tablet, Rfl: 2   nicotine (NICODERM CQ - DOSED IN MG/24 HR) 7 mg/24hr patch, Place 1 patch (7 mg total) onto the skin daily. (Patient not taking: Reported on 12/01/2022), Disp: 28 patch, Rfl: 0   Cardiovascular and other pertinent studies:  Reviewed external labs and tests, independently interpreted  Echocardiogram 12/08/2022: 1. Basal septum measures 1.8 cm (Under estimated and under appreciated on  non contrast images). In addition, there is severe hypertrophy of the  papillary muscle causing LVOT obstruction and near cavity obliteration,  with peak instantaneous gradient of 138 mmHg.  Left ventricular ejection fraction, by estimation, is 65 to 70%.  The left ventricle has normal function. The left ventricle has no regional  wall motion abnormalities. There is severe left ventricular hypertrophy of  the basal-septal segment.  Left ventricular diastolic parameters are consistent with Grade I  diastolic dysfunction (impaired relaxation).   2. Right ventricular systolic function is normal. The right ventricular  size is normal. There is normal pulmonary artery systolic pressure.   3. The mitral valve is normal in structure. No evidence of mitral  valve  regurgitation. No evidence of mitral stenosis.   4. The aortic valve is normal in structure. Aortic valve regurgitation is  not visualized. No aortic stenosis is present.   Conclusion(s)/Recommendation(s): Findings consistent with hypertrophic  obstructive cardiomyopathy.   EKG 11/18/2022: Sinus rhythm 84 bpm  Left ventricular hypertrophy Left atrial enlargement. ST-T changes likely due to LVH   CXR 11/01/2022: 1. No acute findings. 2.  Mild atherosclerotic calcification of the aortic arch.     Recent labs: 11/01/2022: Glucose 116, BUN/Cr 14/0.94. EGFR >60. Na/K 136/3.6. Rest of the CMP normal H/H 12/35. MCV 86. Platelets 304   Review of Systems  Cardiovascular:  Positive for syncope. Negative for chest pain, dyspnea on exertion, leg swelling and palpitations.         Vitals:   01/12/23 1149  BP: 109/80  Pulse: 90  SpO2: 96%     Body mass index is 36.28 kg/m. Filed Weights   01/12/23 1149  Weight: 218 lb (98.9 kg)     Objective:   Physical Exam Vitals and nursing note reviewed.  Constitutional:      General: She is not in acute distress. Neck:     Vascular: No JVD.  Cardiovascular:     Rate and Rhythm: Normal rate and regular rhythm.     Heart sounds: Murmur heard.     Harsh holosystolic murmur is present with a grade of 4/6 at the upper right sternal border radiating to the neck. Murmur increased with Valsalva  Pulmonary:     Effort: Pulmonary effort is normal.     Breath sounds: Normal breath sounds. No wheezing or rales.  Musculoskeletal:     Right lower leg: No edema.     Left lower leg: No edema.          Visit diagnoses:   ICD-10-CM   1. Obstructive hypertrophic cardiomyopathy (HCC)  I42.1 metoprolol succinate (TOPROL-XL) 50 MG 24 hr tablet    Ambulatory referral to Cardiology    2. Syncope and collapse  R55 metoprolol succinate (TOPROL-XL) 50 MG 24 hr tablet    Ambulatory referral to Cardiology       Orders Placed This Encounter  Procedures   Ambulatory referral to Cardiology        Assessment & Recommendations:    47 y.o. Caucasian female with hypertrophic obstructive cardiomyopathy, exertional syncope  Hypertrophic obstructive cardiomyopathy, exertional syncope: Exertional syncope, LVH and strain pattern on EKG without known hypertension, and physical exam with harsh holosystolic murmur worse with Valsalva. She has had at least 1 family member with sudden death  at age 11, which was previously presumed to be due to heart attack. Echocardiogram (11/2022) showed basal septum measures 1.8 cm (Under estimated and under appreciated on non contrast images). In addition, there is severe hypertrophy of the papillary muscle causing LVOT obstruction and near cavity obliteration, with peak instantaneous gradient of 138 mmHg.  Continue to avoid heavy physical exertion. Increase metoprolol succinate to 50 mg daily.  Will obtain genetic testing.  In addition, I will refer her to Dr. Derinda Sis at [facility].  Nicotine dependence: Patient has quit cigarettes. She is currently vaping, but hoping to quit soon.  F/u in 3 months  Thank you for referring the patient to [place]. Please feel free to contact with any questions.   Nigel Mormon, MD Pager: [phone] Office: [phone]

## 2023-03-02 ENCOUNTER — Telehealth (HOSPITAL_COMMUNITY): Payer: Self-pay | Admitting: *Deleted

## 2023-03-02 NOTE — Telephone Encounter (Signed)
Attempted to call patient regarding upcoming cardiac MRI appointment. Left message on voicemail with name and callback number  Jowana Thumma RN Navigator Cardiac Imaging [facility] [phone] Office [phone] Cell  

## 2023-03-03 ENCOUNTER — Encounter: Payer: Self-pay | Admitting: Cardiology

## 2023-03-03 ENCOUNTER — Other Ambulatory Visit: Payer: Self-pay | Admitting: Cardiology

## 2023-03-03 ENCOUNTER — Ambulatory Visit (HOSPITAL_COMMUNITY)
Admission: RE | Admit: 2023-03-03 | Discharge: 2023-03-03 | Disposition: A | Discharge status: Home / Self Care | Payer: Commercial Managed Care - PPO | Source: Ambulatory Visit | Attending: Cardiology | Admitting: Cardiology

## 2023-03-03 DIAGNOSIS — R55 Syncope and collapse: Secondary | ICD-10-CM

## 2023-03-03 DIAGNOSIS — R011 Cardiac murmur, unspecified: Secondary | ICD-10-CM

## 2023-03-03 DIAGNOSIS — I350 Nonrheumatic aortic (valve) stenosis: Secondary | ICD-10-CM

## 2023-03-03 MED ORDER — GADOBUTROL 1 MMOL/ML IV SOLN
12.0000 mL | Freq: Once | INTRAVENOUS | Status: AC | PRN
  Administered 2023-03-03: 12 mL via INTRAVENOUS

## 2023-03-06 NOTE — Progress Notes (Signed)
Reviewed the findings. 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 Some kind of aortic pathology was accurately suspected by Dr. Regino Schultze at [facility], who saw the patient for possible HCM.   I called the patient to discuss the results and left a voicemail. I will place a referral for her to be seen by cardiothoracic surgery for aortic valve replacement.  Elder Negus, MD

## 2023-03-16 NOTE — Telephone Encounter (Signed)
From patient.

## 2023-03-21 NOTE — Telephone Encounter (Signed)
From patient.

## 2023-03-22 NOTE — Telephone Encounter (Signed)
FROM pt

## 2023-04-05 ENCOUNTER — Other Ambulatory Visit: Payer: Self-pay | Admitting: Surgery

## 2023-04-05 ENCOUNTER — Institutional Professional Consult (permissible substitution) (INDEPENDENT_AMBULATORY_CARE_PROVIDER_SITE_OTHER): Payer: Commercial Managed Care - PPO | Admitting: Surgery

## 2023-04-05 ENCOUNTER — Encounter: Payer: Self-pay | Admitting: Surgery

## 2023-04-05 VITALS — BP 125/84 | HR 85 | Resp 18 | Ht 65.0 in | Wt 218.0 lb

## 2023-04-05 DIAGNOSIS — I350 Nonrheumatic aortic (valve) stenosis: Secondary | ICD-10-CM
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
## 2023-04-10 ENCOUNTER — Telehealth (HOSPITAL_COMMUNITY): Payer: Self-pay | Admitting: Emergency Medicine

## 2023-04-10 DIAGNOSIS — R079 Chest pain, unspecified: Secondary | ICD-10-CM

## 2023-04-10 MED ORDER — METOPROLOL TARTRATE 100 MG PO TABS
100.0000 mg | ORAL_TABLET | Freq: Once | ORAL | 0 refills | Status: DC
Start: 2023-04-10 — End: 2023-05-23

## 2023-04-10 NOTE — Telephone Encounter (Signed)
Reaching out to patient to offer assistance regarding upcoming cardiac imaging study; pt verbalizes understanding of appt date/time, parking situation and where to check in, pre-test NPO status and medications ordered, and verified current allergies; name and call back number provided for further questions should they arise Aiken Withem RN Navigator Cardiac Imaging [facility] [phone] office [phone] cell  100mg metoprolol  

## 2023-04-11 ENCOUNTER — Ambulatory Visit (HOSPITAL_COMMUNITY)
Admission: RE | Admit: 2023-04-11 | Discharge: 2023-04-11 | Disposition: A | Discharge status: Home / Self Care | Payer: Commercial Managed Care - PPO | Source: Ambulatory Visit | Attending: Surgery | Admitting: Surgery

## 2023-04-11 DIAGNOSIS — I350 Nonrheumatic aortic (valve) stenosis: Secondary | ICD-10-CM

## 2023-04-11 MED ORDER — IOHEXOL 350 MG/ML SOLN
95.0000 mL | Freq: Once | INTRAVENOUS | Status: AC | PRN
  Administered 2023-04-11: 95 mL via INTRAVENOUS

## 2023-04-11 MED ORDER — NITROGLYCERIN 0.4 MG SL SUBL
SUBLINGUAL_TABLET | SUBLINGUAL | Status: AC
  Filled 2023-04-11: qty 2

## 2023-04-11 MED ORDER — NITROGLYCERIN 0.4 MG SL SUBL
0.8000 mg | SUBLINGUAL_TABLET | SUBLINGUAL | Status: DC | PRN
  Administered 2023-04-11: 0.8 mg via SUBLINGUAL

## 2023-04-11 NOTE — Progress Notes (Signed)
Patient tolerated without distress 

## 2023-04-14 ENCOUNTER — Ambulatory Visit: Payer: Commercial Managed Care - PPO | Admitting: Cardiology

## 2023-04-24 ENCOUNTER — Other Ambulatory Visit: Payer: Self-pay | Admitting: *Deleted

## 2023-04-24 ENCOUNTER — Encounter: Payer: Self-pay | Admitting: *Deleted

## 2023-04-24 DIAGNOSIS — I350 Nonrheumatic aortic (valve) stenosis: Secondary | ICD-10-CM

## 2023-04-26 ENCOUNTER — Ambulatory Visit: Payer: Commercial Managed Care - PPO | Admitting: Cardiology

## 2023-05-10 ENCOUNTER — Encounter (HOSPITAL_COMMUNITY): Payer: Self-pay

## 2023-05-10 NOTE — Pre-Procedure Instructions (Signed)
Surgical Instructions   Your procedure is scheduled on Monday, July 22nd. Report to [facility] Main Entrance "A" at 05:30 A.M., then check in with the Admitting office. Any questions or running late day of surgery: call [phone]  Questions prior to your surgery date: call [phone], Monday-Friday, 8am-4pm. If you experience any cold or flu symptoms such as cough, fever, chills, shortness of breath, etc. between now and your scheduled surgery, please notify us at the above number.     Remember:  Do not eat or drink after midnight the night before your surgery      Take these medicines the morning of surgery with A SIP OF WATER  metoprolol succinate (TOPROL-XL)     One week prior to surgery, STOP taking any Aspirin (unless otherwise instructed by your surgeon) Aleve, Naproxen, Ibuprofen, Motrin, Advil, Goody's, BC's, all herbal medications, fish oil, and non-prescription vitamins.                     Do NOT Smoke (Tobacco/Vaping) for 24 hours prior to your procedure.  If you use a CPAP at night, you may bring your mask/headgear for your overnight stay.   You will be asked to remove any contacts, glasses, piercing's, hearing aid's, dentures/partials prior to surgery. Please bring cases for these items if needed.    Patients discharged the day of surgery will not be allowed to drive home, and someone needs to stay with them for 24 hours.  SURGICAL WAITING ROOM VISITATION Patients may have no more than 2 support people in the waiting area - these visitors may rotate.   Pre-op nurse will coordinate an appropriate time for 1 ADULT support person, who may not rotate, to accompany patient in pre-op.  Children under the age of 78 must have an adult with them who is not the patient and must remain in the main waiting area with an adult.  If the patient needs to stay at the hospital during part of their recovery, the visitor guidelines for inpatient rooms apply.  Please refer to  the [facility] website for the visitor guidelines for any additional information.   If you received a COVID test during your pre-op visit  it is requested that you wear a mask when out in public, stay away from anyone that may not be feeling well and notify your surgeon if you develop symptoms. If you have been in contact with anyone that has tested positive in the last 10 days please notify you surgeon.      Pre-operative CHG Bathing Instructions   You can play a key role in reducing the risk of infection after surgery. Your skin needs to be as free of germs as possible. You can reduce the number of germs on your skin by washing with CHG (chlorhexidine gluconate) soap before surgery. CHG is an antiseptic soap that kills germs and continues to kill germs even after washing.   DO NOT use if you have an allergy to chlorhexidine/CHG or antibacterial soaps. If your skin becomes reddened or irritated, stop using the CHG and notify one of our RNs at [phone].                 TAKE A SHOWER THE NIGHT BEFORE SURGERY AND THE DAY OF SURGERY    Please keep in mind the following:  DO NOT shave, including legs and underarms, 48 hours prior to surgery.   You may shave your face before/day of surgery.  Place clean sheets  on your bed the night before surgery Use a clean washcloth (not used since being washed) for each shower. DO NOT sleep with pet's night before surgery.  CHG Shower Instructions:  If you choose to wash your hair and private area, wash first with your normal shampoo/soap.  After you use shampoo/soap, rinse your hair and body thoroughly to remove shampoo/soap residue.  Turn the water OFF and apply half the bottle of CHG soap to a CLEAN washcloth.  Apply CHG soap ONLY FROM YOUR NECK DOWN TO YOUR TOES (washing for 3-5 minutes)  DO NOT use CHG soap on face, private areas, open wounds, or sores.  Pay special attention to the area where your surgery is being performed.  If you are having  back surgery, having someone wash your back for you may be helpful. Wait 2 minutes after CHG soap is applied, then you may rinse off the CHG soap.  Pat dry with a clean towel  Put on clean pajamas    Additional instructions for the day of surgery: DO NOT APPLY any lotions, deodorants, cologne, or perfumes.   Do not wear jewelry or makeup Do not wear nail polish, gel polish, artificial nails, or any other type of covering on natural nails (fingers and toes) Do not bring valuables to the hospital. [facility] is not responsible for valuables/personal belongings. Put on clean/comfortable clothes.  Please brush your teeth.  Ask your nurse before applying any prescription medications to the skin.

## 2023-05-11 ENCOUNTER — Other Ambulatory Visit: Payer: Self-pay

## 2023-05-11 ENCOUNTER — Other Ambulatory Visit (HOSPITAL_COMMUNITY)
Admission: RE | Admit: 2023-05-11 | Discharge: 2023-05-11 | Disposition: A | Discharge status: Home / Self Care | Payer: Commercial Managed Care - PPO | Source: Ambulatory Visit | Attending: Surgery | Admitting: Surgery

## 2023-05-11 ENCOUNTER — Encounter (HOSPITAL_COMMUNITY): Payer: Self-pay

## 2023-05-11 ENCOUNTER — Ambulatory Visit (HOSPITAL_COMMUNITY)
Admission: RE | Admit: 2023-05-11 | Discharge: 2023-05-11 | Disposition: A | Discharge status: Home / Self Care | Payer: Commercial Managed Care - PPO | Source: Ambulatory Visit | Attending: Surgery | Admitting: Surgery

## 2023-05-11 VITALS — BP 136/74 | Temp 98.3°F | Resp 18 | Ht 65.0 in | Wt 219.0 lb

## 2023-05-11 DIAGNOSIS — Z01810 Encounter for preprocedural cardiovascular examination: Secondary | ICD-10-CM | POA: Diagnosis not present

## 2023-05-11 DIAGNOSIS — I350 Nonrheumatic aortic (valve) stenosis: Secondary | ICD-10-CM

## 2023-05-11 DIAGNOSIS — Z1152 Encounter for screening for COVID-19: Secondary | ICD-10-CM | POA: Insufficient documentation

## 2023-05-11 DIAGNOSIS — Z01818 Encounter for other preprocedural examination: Secondary | ICD-10-CM | POA: Diagnosis present

## 2023-05-11 HISTORY — DX: Essential (primary) hypertension: I10

## 2023-05-11 HISTORY — DX: Nonrheumatic aortic (valve) stenosis: I35.0

## 2023-05-11 HISTORY — DX: Cardiac murmur, unspecified: R01.1

## 2023-05-11 LAB — BLOOD GAS, ARTERIAL
Acid-base deficit: 0.7 mmol/L (ref 0.0–2.0)
Bicarbonate: 23.4 mmol/L (ref 20.0–28.0)
Drawn by: 58793
O2 Saturation: 100 %
Patient temperature: 37
pCO2 arterial: 36 mmHg (ref 32–48)
pH, Arterial: 7.42 (ref 7.35–7.45)
pO2, Arterial: 118 mmHg — ABNORMAL HIGH (ref 83–108)

## 2023-05-11 LAB — URINALYSIS, ROUTINE W REFLEX MICROSCOPIC
Bilirubin Urine: NEGATIVE
Glucose, UA: NEGATIVE mg/dL
Hgb urine dipstick: NEGATIVE
Ketones, ur: NEGATIVE mg/dL
Leukocytes,Ua: NEGATIVE
Nitrite: NEGATIVE
Protein, ur: NEGATIVE mg/dL
Specific Gravity, Urine: 1.009 (ref 1.005–1.030)
pH: 6 (ref 5.0–8.0)

## 2023-05-11 LAB — TYPE AND SCREEN
ABO/RH(D): A POS
Antibody Screen: NEGATIVE

## 2023-05-11 LAB — COMPREHENSIVE METABOLIC PANEL
ALT: 30 U/L (ref 0–44)
AST: 20 U/L (ref 15–41)
Albumin: 3.3 g/dL — ABNORMAL LOW (ref 3.5–5.0)
Alkaline Phosphatase: 53 U/L (ref 38–126)
Anion gap: 6 (ref 5–15)
BUN: 12 mg/dL (ref 6–20)
CO2: 20 mmol/L — ABNORMAL LOW (ref 22–32)
Calcium: 8.7 mg/dL — ABNORMAL LOW (ref 8.9–10.3)
Chloride: 108 mmol/L (ref 98–111)
Creatinine, Ser: 0.63 mg/dL (ref 0.44–1.00)
GFR, Estimated: >60 mL/min (ref >60)
Glucose, Bld: 98 mg/dL (ref 70–99)
Potassium: 3.9 mmol/L (ref 3.5–5.1)
Sodium: 134 mmol/L — ABNORMAL LOW (ref 135–145)
Total Bilirubin: 0.4 mg/dL (ref 0.3–1.2)
Total Protein: 6.7 g/dL (ref 6.5–8.1)

## 2023-05-11 LAB — PROTIME-INR
INR: 1 (ref 0.8–1.2)
Prothrombin Time: 13.6 seconds (ref 11.4–15.2)

## 2023-05-11 LAB — CBC
HCT: 38.3 % (ref 36.0–46.0)
Hemoglobin: 13.1 g/dL (ref 12.0–15.0)
MCH: 29.4 pg (ref 26.0–34.0)
MCHC: 34.2 g/dL (ref 30.0–36.0)
MCV: 86.1 fL (ref 80.0–100.0)
Platelets: 301 10*3/uL (ref 150–400)
RBC: 4.45 MIL/uL (ref 3.87–5.11)
RDW: 12.5 % (ref 11.5–15.5)
WBC: 9.2 10*3/uL (ref 4.0–10.5)
nRBC: 0 % (ref 0.0–0.2)

## 2023-05-11 LAB — HEMOGLOBIN A1C
Hgb A1c MFr Bld: 5.3 % (ref 4.8–5.6)
Mean Plasma Glucose: 105 mg/dL

## 2023-05-11 LAB — SURGICAL PCR SCREEN
MRSA, PCR: NEGATIVE
Staphylococcus aureus: NEGATIVE

## 2023-05-11 LAB — APTT: aPTT: 31 seconds (ref 24–36)

## 2023-05-11 NOTE — Progress Notes (Signed)
VASCULAR LAB    Carotid duplex has been performed.  See CV proc for preliminary results.   Keyondra Lagrand, RVT 05/11/2023, 10:35 AM

## 2023-05-11 NOTE — Progress Notes (Signed)
PCP - denies Cardiologist - Dr. Truett Mainland  PPM/ICD - denies   Chest x-ray - 05/11/23 EKG - 05/11/23 Stress Test - denies ECHO - 12/08/22 Cardiac Cath - denies  Sleep Study - denies   DM- denies  ASA/Blood Thinner Instructions: n/a   ERAS Protcol - no, NPO   COVID TEST- 05/11/23   Anesthesia review: yes, cardiac hx  Patient denies shortness of breath, fever, cough and chest pain at PAT appointment   All instructions explained to the patient, with a verbal understanding of the material. Patient agrees to go over the instructions while at home for a better understanding. Patient also instructed to wear a mask in public after being tested for COVID-19. The opportunity to ask questions was provided.

## 2023-05-12 LAB — SARS CORONAVIRUS 2 (TAT 6-24 HRS): SARS Coronavirus 2: NEGATIVE

## 2023-05-12 MED ORDER — TRANEXAMIC ACID 1000 MG/10ML IV SOLN
1.5000 mg/kg/h | INTRAVENOUS | Status: AC
  Administered 2023-05-15: 1.5 mg/kg/h via INTRAVENOUS
  Filled 2023-05-12: qty 25

## 2023-05-12 MED ORDER — PHENYLEPHRINE HCL-NACL 20-0.9 MG/250ML-% IV SOLN
30.0000 ug/min | INTRAVENOUS | Status: AC
  Administered 2023-05-15: 50 ug/min via INTRAVENOUS
  Filled 2023-05-12: qty 250

## 2023-05-12 MED ORDER — INSULIN REGULAR(HUMAN) IN NACL 100-0.9 UT/100ML-% IV SOLN
INTRAVENOUS | Status: AC
  Administered 2023-05-15: 1.1 [IU]/h via INTRAVENOUS
  Filled 2023-05-12: qty 100

## 2023-05-12 MED ORDER — MANNITOL 20 % IV SOLN
INTRAVENOUS | Status: DC
  Filled 2023-05-12: qty 13

## 2023-05-12 MED ORDER — HEPARIN 30,000 UNITS/1000 ML (OHS) CELLSAVER SOLUTION
Status: DC
  Filled 2023-05-12: qty 1000

## 2023-05-12 MED ORDER — VANCOMYCIN HCL 1500 MG/300ML IV SOLN
1500.0000 mg | INTRAVENOUS | Status: AC
  Administered 2023-05-15: 1500 mg via INTRAVENOUS
  Filled 2023-05-12: qty 300

## 2023-05-12 MED ORDER — POTASSIUM CHLORIDE 2 MEQ/ML IV SOLN
80.0000 meq | INTRAVENOUS | Status: DC
  Filled 2023-05-12: qty 40

## 2023-05-12 MED ORDER — CEFAZOLIN SODIUM-DEXTROSE 2-4 GM/100ML-% IV SOLN
2.0000 g | INTRAVENOUS | Status: DC
  Filled 2023-05-12: qty 100

## 2023-05-12 MED ORDER — MILRINONE LACTATE IN DEXTROSE 20-5 MG/100ML-% IV SOLN
0.3000 ug/kg/min | INTRAVENOUS | Status: DC
  Filled 2023-05-12: qty 100

## 2023-05-12 MED ORDER — EPINEPHRINE HCL 5 MG/250ML IV SOLN IN NS
0.0000 ug/min | INTRAVENOUS | Status: DC
  Filled 2023-05-12: qty 250

## 2023-05-12 MED ORDER — CEFAZOLIN SODIUM-DEXTROSE 2-4 GM/100ML-% IV SOLN
2.0000 g | INTRAVENOUS | Status: AC
  Administered 2023-05-15 (×2): 2 g via INTRAVENOUS
  Filled 2023-05-12: qty 100

## 2023-05-12 MED ORDER — TRANEXAMIC ACID (OHS) PUMP PRIME SOLUTION
2.0000 mg/kg | INTRAVENOUS | Status: DC
  Filled 2023-05-12: qty 1.99

## 2023-05-12 MED ORDER — DEXMEDETOMIDINE HCL IN NACL 400 MCG/100ML IV SOLN
0.1000 ug/kg/h | INTRAVENOUS | Status: AC
  Administered 2023-05-15: .7 ug/kg/h via INTRAVENOUS
  Administered 2023-05-15: .4 ug/kg/h via INTRAVENOUS
  Filled 2023-05-12: qty 100

## 2023-05-12 MED ORDER — PLASMA-LYTE A IV SOLN
INTRAVENOUS | Status: DC
  Filled 2023-05-12: qty 2.5

## 2023-05-12 MED ORDER — NOREPINEPHRINE 4 MG/250ML-% IV SOLN
0.0000 ug/min | INTRAVENOUS | Status: DC
  Filled 2023-05-12: qty 250

## 2023-05-12 MED ORDER — NITROGLYCERIN IN D5W 200-5 MCG/ML-% IV SOLN
2.0000 ug/min | INTRAVENOUS | Status: DC
  Filled 2023-05-12: qty 250

## 2023-05-12 MED ORDER — TRANEXAMIC ACID (OHS) BOLUS VIA INFUSION
15.0000 mg/kg | INTRAVENOUS | Status: AC
  Administered 2023-05-15: 1489.5 mg via INTRAVENOUS
  Filled 2023-05-12: qty 1490

## 2023-05-14 NOTE — Anesthesia Preprocedure Evaluation (Signed)
Anesthesia Evaluation  Patient identified by MRN, date of birth, ID band Patient awake    Reviewed: Allergy & Precautions, NPO status , Patient's Chart, lab work & pertinent test results, reviewed documented beta blocker date and time   History of Anesthesia Complications Negative for: history of anesthetic complications  Airway Mallampati: II  TM Distance: >3 FB Neck ROM: Full    Dental  (+) Edentulous Upper, Dental Advisory Given   Pulmonary former smoker   breath sounds clear to auscultation       Cardiovascular hypertension, Pt. on medications and Pt. on home beta blockers (-) angina + Valvular Problems/Murmurs (critical AS) AS  Rhythm:Regular Rate:Normal + Systolic murmurs 06/8118 ECHo: severe LVH of the basal-septal region and papillary muscle, Grade 1 DD, EF 65-70%, normal RVF, Aortic valve mean gradient 88.7 mmHg, peak gradient 138.8 mmHg, severe AS with AVA 0.57 cm2  03/2023 Cardiac CTA: non-obstructive coronary disease, bicuspid aortic valve with RCC/LCC fusion   Neuro/Psych negative neurological ROS     GI/Hepatic negative GI ROS, Neg liver ROS,,,(+)       marijuana use  Endo/Other  BMI 36  Renal/GU negative Renal ROS     Musculoskeletal   Abdominal  (+) + obese  Peds  Hematology Hb 13.1, plt 301k   Anesthesia Other Findings   Reproductive/Obstetrics                             Anesthesia Physical Anesthesia Plan  ASA: 4  Anesthesia Plan: General   Post-op Pain Management:    Induction: Intravenous  PONV Risk Score and Plan: Treatment may vary due to age or medical condition  Airway Management Planned: Oral ETT  Additional Equipment: Ultrasound Guidance Line Placement, TEE, PA Cath and Arterial line  Intra-op Plan:   Post-operative Plan: Post-operative intubation/ventilation  Informed Consent: I have reviewed the patients History and Physical, chart, labs and  discussed the procedure including the risks, benefits and alternatives for the proposed anesthesia with the patient or authorized representative who has indicated his/her understanding and acceptance.     Dental advisory given  Plan Discussed with: CRNA and Surgeon  Anesthesia Plan Comments:         Anesthesia Quick Evaluation

## 2023-05-15 ENCOUNTER — Inpatient Hospital Stay (HOSPITAL_COMMUNITY)
Admission: RE | Admit: 2023-05-15 | Discharge: 2023-05-23 | DRG: 220 | Disposition: A | Discharge status: Home / Self Care | Payer: Commercial Managed Care - PPO | Attending: Surgery | Admitting: Surgery

## 2023-05-15 ENCOUNTER — Inpatient Hospital Stay (HOSPITAL_COMMUNITY): Payer: Commercial Managed Care - PPO

## 2023-05-15 ENCOUNTER — Encounter (HOSPITAL_COMMUNITY): Payer: Self-pay | Admitting: Surgery

## 2023-05-15 ENCOUNTER — Encounter (HOSPITAL_COMMUNITY)
Admission: RE | Disposition: A | Discharge status: Home / Self Care | Payer: Self-pay | Source: Home / Self Care | Attending: Surgery

## 2023-05-15 ENCOUNTER — Other Ambulatory Visit: Payer: Self-pay

## 2023-05-15 ENCOUNTER — Inpatient Hospital Stay (HOSPITAL_COMMUNITY): Payer: Commercial Managed Care - PPO | Admitting: Vascular Surgery

## 2023-05-15 DIAGNOSIS — I350 Nonrheumatic aortic (valve) stenosis: Secondary | ICD-10-CM | POA: Diagnosis not present

## 2023-05-15 DIAGNOSIS — Q231 Congenital insufficiency of aortic valve: Secondary | ICD-10-CM | POA: Diagnosis not present

## 2023-05-15 DIAGNOSIS — E669 Obesity, unspecified: Secondary | ICD-10-CM

## 2023-05-15 DIAGNOSIS — Z6836 Body mass index (BMI) 36.0-36.9, adult: Secondary | ICD-10-CM

## 2023-05-15 DIAGNOSIS — Z952 Presence of prosthetic heart valve: Secondary | ICD-10-CM

## 2023-05-15 DIAGNOSIS — D62 Acute posthemorrhagic anemia: Secondary | ICD-10-CM | POA: Diagnosis not present

## 2023-05-15 DIAGNOSIS — I10 Essential (primary) hypertension: Secondary | ICD-10-CM | POA: Diagnosis not present

## 2023-05-15 DIAGNOSIS — R5383 Other fatigue: Secondary | ICD-10-CM | POA: Diagnosis present

## 2023-05-15 DIAGNOSIS — F17210 Nicotine dependence, cigarettes, uncomplicated: Secondary | ICD-10-CM | POA: Diagnosis present

## 2023-05-15 DIAGNOSIS — Z888 Allergy status to other drugs, medicaments and biological substances status: Secondary | ICD-10-CM | POA: Diagnosis not present

## 2023-05-15 DIAGNOSIS — E8770 Fluid overload, unspecified: Secondary | ICD-10-CM | POA: Diagnosis not present

## 2023-05-15 DIAGNOSIS — R110 Nausea: Secondary | ICD-10-CM | POA: Diagnosis not present

## 2023-05-15 DIAGNOSIS — Z01818 Encounter for other preprocedural examination: Secondary | ICD-10-CM

## 2023-05-15 DIAGNOSIS — Z79899 Other long term (current) drug therapy: Secondary | ICD-10-CM

## 2023-05-15 DIAGNOSIS — Z8249 Family history of ischemic heart disease and other diseases of the circulatory system: Secondary | ICD-10-CM

## 2023-05-15 DIAGNOSIS — R55 Syncope and collapse: Secondary | ICD-10-CM | POA: Diagnosis present

## 2023-05-15 HISTORY — PX: AORTIC VALVE REPLACEMENT: SHX41

## 2023-05-15 HISTORY — PX: TEE WITHOUT CARDIOVERSION: SHX5443

## 2023-05-15 HISTORY — DX: Presence of prosthetic heart valve: Z95.2

## 2023-05-15 LAB — POCT I-STAT 7, (LYTES, BLD GAS, ICA,H+H)
Acid-base deficit: 2 mmol/L (ref 0.0–2.0)
Acid-base deficit: 1 mmol/L (ref 0.0–2.0)
Acid-base deficit: 3 mmol/L — ABNORMAL HIGH (ref 0.0–2.0)
Acid-base deficit: 2 mmol/L (ref 0.0–2.0)
Acid-base deficit: 5 mmol/L — ABNORMAL HIGH (ref 0.0–2.0)
Acid-base deficit: 6 mmol/L — ABNORMAL HIGH (ref 0.0–2.0)
Acid-base deficit: 5 mmol/L — ABNORMAL HIGH (ref 0.0–2.0)
Acid-base deficit: 5 mmol/L — ABNORMAL HIGH (ref 0.0–2.0)
Bicarbonate: 24.4 mmol/L (ref 20.0–28.0)
Bicarbonate: 23.1 mmol/L (ref 20.0–28.0)
Bicarbonate: 22.9 mmol/L (ref 20.0–28.0)
Bicarbonate: 23.4 mmol/L (ref 20.0–28.0)
Bicarbonate: 20.8 mmol/L (ref 20.0–28.0)
Bicarbonate: 21 mmol/L (ref 20.0–28.0)
Bicarbonate: 21 mmol/L (ref 20.0–28.0)
Bicarbonate: 20.9 mmol/L (ref 20.0–28.0)
Calcium, Ion: 1.17 mmol/L (ref 1.15–1.40)
Calcium, Ion: 0.81 mmol/L — CL (ref 1.15–1.40)
Calcium, Ion: 0.9 mmol/L — ABNORMAL LOW (ref 1.15–1.40)
Calcium, Ion: 0.87 mmol/L — CL (ref 1.15–1.40)
Calcium, Ion: 1.08 mmol/L — ABNORMAL LOW (ref 1.15–1.40)
Calcium, Ion: 1.23 mmol/L (ref 1.15–1.40)
Calcium, Ion: 1.16 mmol/L (ref 1.15–1.40)
Calcium, Ion: 1.16 mmol/L (ref 1.15–1.40)
HCT: 35 % — ABNORMAL LOW (ref 36.0–46.0)
HCT: 23 % — ABNORMAL LOW (ref 36.0–46.0)
HCT: 26 % — ABNORMAL LOW (ref 36.0–46.0)
HCT: 26 % — ABNORMAL LOW (ref 36.0–46.0)
HCT: 26 % — ABNORMAL LOW (ref 36.0–46.0)
HCT: 25 % — ABNORMAL LOW (ref 36.0–46.0)
HCT: 24 % — ABNORMAL LOW (ref 36.0–46.0)
HCT: 25 % — ABNORMAL LOW (ref 36.0–46.0)
Hemoglobin: 11.9 g/dL — ABNORMAL LOW (ref 12.0–15.0)
Hemoglobin: 7.8 g/dL — ABNORMAL LOW (ref 12.0–15.0)
Hemoglobin: 8.8 g/dL — ABNORMAL LOW (ref 12.0–15.0)
Hemoglobin: 8.8 g/dL — ABNORMAL LOW (ref 12.0–15.0)
Hemoglobin: 8.8 g/dL — ABNORMAL LOW (ref 12.0–15.0)
Hemoglobin: 8.5 g/dL — ABNORMAL LOW (ref 12.0–15.0)
Hemoglobin: 8.2 g/dL — ABNORMAL LOW (ref 12.0–15.0)
Hemoglobin: 8.5 g/dL — ABNORMAL LOW (ref 12.0–15.0)
O2 Saturation: 100 %
O2 Saturation: 100 %
O2 Saturation: 100 %
O2 Saturation: 100 %
O2 Saturation: 100 %
O2 Saturation: 97 %
O2 Saturation: 99 %
O2 Saturation: 99 %
Patient temperature: 36.1
Patient temperature: 36.9
Patient temperature: 37
Potassium: 3.9 mmol/L (ref 3.5–5.1)
Potassium: 4.2 mmol/L (ref 3.5–5.1)
Potassium: 4.1 mmol/L (ref 3.5–5.1)
Potassium: 4.4 mmol/L (ref 3.5–5.1)
Potassium: 3.6 mmol/L (ref 3.5–5.1)
Potassium: 3.9 mmol/L (ref 3.5–5.1)
Potassium: 5.1 mmol/L (ref 3.5–5.1)
Potassium: 5 mmol/L (ref 3.5–5.1)
Sodium: 138 mmol/L (ref 135–145)
Sodium: 138 mmol/L (ref 135–145)
Sodium: 136 mmol/L (ref 135–145)
Sodium: 135 mmol/L (ref 135–145)
Sodium: 136 mmol/L (ref 135–145)
Sodium: 138 mmol/L (ref 135–145)
Sodium: 135 mmol/L (ref 135–145)
Sodium: 136 mmol/L (ref 135–145)
TCO2: 26 mmol/L (ref 22–32)
TCO2: 24 mmol/L (ref 22–32)
TCO2: 24 mmol/L (ref 22–32)
TCO2: 25 mmol/L (ref 22–32)
TCO2: 22 mmol/L (ref 22–32)
TCO2: 22 mmol/L (ref 22–32)
TCO2: 22 mmol/L (ref 22–32)
TCO2: 22 mmol/L (ref 22–32)
pCO2 arterial: 49.2 mmHg — ABNORMAL HIGH (ref 32–48)
pCO2 arterial: 35.1 mmHg (ref 32–48)
pCO2 arterial: 42.4 mmHg (ref 32–48)
pCO2 arterial: 44.2 mmHg (ref 32–48)
pCO2 arterial: 39 mmHg (ref 32–48)
pCO2 arterial: 45.1 mmHg (ref 32–48)
pCO2 arterial: 43.1 mmHg (ref 32–48)
pCO2 arterial: 43.1 mmHg (ref 32–48)
pH, Arterial: 7.304 — ABNORMAL LOW (ref 7.35–7.45)
pH, Arterial: 7.426 (ref 7.35–7.45)
pH, Arterial: 7.341 — ABNORMAL LOW (ref 7.35–7.45)
pH, Arterial: 7.331 — ABNORMAL LOW (ref 7.35–7.45)
pH, Arterial: 7.336 — ABNORMAL LOW (ref 7.35–7.45)
pH, Arterial: 7.272 — ABNORMAL LOW (ref 7.35–7.45)
pH, Arterial: 7.296 — ABNORMAL LOW (ref 7.35–7.45)
pH, Arterial: 7.294 — ABNORMAL LOW (ref 7.35–7.45)
pO2, Arterial: 323 mmHg — ABNORMAL HIGH (ref 83–108)
pO2, Arterial: 421 mmHg — ABNORMAL HIGH (ref 83–108)
pO2, Arterial: 393 mmHg — ABNORMAL HIGH (ref 83–108)
pO2, Arterial: 352 mmHg — ABNORMAL HIGH (ref 83–108)
pO2, Arterial: 410 mmHg — ABNORMAL HIGH (ref 83–108)
pO2, Arterial: 99 mmHg (ref 83–108)
pO2, Arterial: 142 mmHg — ABNORMAL HIGH (ref 83–108)
pO2, Arterial: 131 mmHg — ABNORMAL HIGH (ref 83–108)

## 2023-05-15 LAB — ECHO INTRAOPERATIVE TEE
AV Mean grad: 76 mmHg
Height: 65 in
Weight: 3504 oz

## 2023-05-15 LAB — POCT I-STAT EG7
Acid-Base Excess: 8 mmol/L — ABNORMAL HIGH (ref 0.0–2.0)
Bicarbonate: 33.8 mmol/L — ABNORMAL HIGH (ref 20.0–28.0)
Calcium, Ion: 0.93 mmol/L — ABNORMAL LOW (ref 1.15–1.40)
HCT: 26 % — ABNORMAL LOW (ref 36.0–46.0)
Hemoglobin: 8.8 g/dL — ABNORMAL LOW (ref 12.0–15.0)
O2 Saturation: 80 %
Potassium: 3.9 mmol/L (ref 3.5–5.1)
Sodium: 143 mmol/L (ref 135–145)
TCO2: 35 mmol/L — ABNORMAL HIGH (ref 22–32)
pCO2, Ven: 54.2 mmHg (ref 44–60)
pH, Ven: 7.402 (ref 7.25–7.43)
pO2, Ven: 45 mmHg (ref 32–45)

## 2023-05-15 LAB — CBC
HCT: 23.9 % — ABNORMAL LOW (ref 36.0–46.0)
HCT: 26 % — ABNORMAL LOW (ref 36.0–46.0)
Hemoglobin: 8 g/dL — ABNORMAL LOW (ref 12.0–15.0)
Hemoglobin: 8.8 g/dL — ABNORMAL LOW (ref 12.0–15.0)
MCH: 29.6 pg (ref 26.0–34.0)
MCH: 29.4 pg (ref 26.0–34.0)
MCHC: 33.5 g/dL (ref 30.0–36.0)
MCHC: 33.8 g/dL (ref 30.0–36.0)
MCV: 88.5 fL (ref 80.0–100.0)
MCV: 87 fL (ref 80.0–100.0)
Platelets: 111 10*3/uL — ABNORMAL LOW (ref 150–400)
Platelets: 156 10*3/uL (ref 150–400)
RBC: 2.7 MIL/uL — ABNORMAL LOW (ref 3.87–5.11)
RBC: 2.99 MIL/uL — ABNORMAL LOW (ref 3.87–5.11)
RDW: 12.8 % (ref 11.5–15.5)
RDW: 12.8 % (ref 11.5–15.5)
WBC: 20.3 10*3/uL — ABNORMAL HIGH (ref 4.0–10.5)
WBC: 18.5 10*3/uL — ABNORMAL HIGH (ref 4.0–10.5)
nRBC: 0 % (ref 0.0–0.2)
nRBC: 0 % (ref 0.0–0.2)

## 2023-05-15 LAB — POCT I-STAT, CHEM 8
BUN: 12 mg/dL (ref 6–20)
BUN: 11 mg/dL (ref 6–20)
BUN: 10 mg/dL (ref 6–20)
BUN: 10 mg/dL (ref 6–20)
BUN: 9 mg/dL (ref 6–20)
BUN: 9 mg/dL (ref 6–20)
Calcium, Ion: 1.24 mmol/L (ref 1.15–1.40)
Calcium, Ion: 1.17 mmol/L (ref 1.15–1.40)
Calcium, Ion: 1.02 mmol/L — ABNORMAL LOW (ref 1.15–1.40)
Calcium, Ion: 0.9 mmol/L — ABNORMAL LOW (ref 1.15–1.40)
Calcium, Ion: 0.93 mmol/L — ABNORMAL LOW (ref 1.15–1.40)
Calcium, Ion: 1.1 mmol/L — ABNORMAL LOW (ref 1.15–1.40)
Chloride: 103 mmol/L (ref 98–111)
Chloride: 103 mmol/L (ref 98–111)
Chloride: 103 mmol/L (ref 98–111)
Chloride: 101 mmol/L (ref 98–111)
Chloride: 102 mmol/L (ref 98–111)
Chloride: 103 mmol/L (ref 98–111)
Creatinine, Ser: 0.6 mg/dL (ref 0.44–1.00)
Creatinine, Ser: 0.6 mg/dL (ref 0.44–1.00)
Creatinine, Ser: 0.5 mg/dL (ref 0.44–1.00)
Creatinine, Ser: 0.4 mg/dL — ABNORMAL LOW (ref 0.44–1.00)
Creatinine, Ser: 0.4 mg/dL — ABNORMAL LOW (ref 0.44–1.00)
Creatinine, Ser: 0.4 mg/dL — ABNORMAL LOW (ref 0.44–1.00)
Glucose, Bld: 116 mg/dL — ABNORMAL HIGH (ref 70–99)
Glucose, Bld: 125 mg/dL — ABNORMAL HIGH (ref 70–99)
Glucose, Bld: 150 mg/dL — ABNORMAL HIGH (ref 70–99)
Glucose, Bld: 202 mg/dL — ABNORMAL HIGH (ref 70–99)
Glucose, Bld: 212 mg/dL — ABNORMAL HIGH (ref 70–99)
Glucose, Bld: 212 mg/dL — ABNORMAL HIGH (ref 70–99)
HCT: 32 % — ABNORMAL LOW (ref 36.0–46.0)
HCT: 29 % — ABNORMAL LOW (ref 36.0–46.0)
HCT: 28 % — ABNORMAL LOW (ref 36.0–46.0)
HCT: 26 % — ABNORMAL LOW (ref 36.0–46.0)
HCT: 26 % — ABNORMAL LOW (ref 36.0–46.0)
HCT: 25 % — ABNORMAL LOW (ref 36.0–46.0)
Hemoglobin: 10.9 g/dL — ABNORMAL LOW (ref 12.0–15.0)
Hemoglobin: 9.9 g/dL — ABNORMAL LOW (ref 12.0–15.0)
Hemoglobin: 9.5 g/dL — ABNORMAL LOW (ref 12.0–15.0)
Hemoglobin: 8.8 g/dL — ABNORMAL LOW (ref 12.0–15.0)
Hemoglobin: 8.8 g/dL — ABNORMAL LOW (ref 12.0–15.0)
Hemoglobin: 8.5 g/dL — ABNORMAL LOW (ref 12.0–15.0)
Potassium: 3.9 mmol/L (ref 3.5–5.1)
Potassium: 4.1 mmol/L (ref 3.5–5.1)
Potassium: 4.5 mmol/L (ref 3.5–5.1)
Potassium: 4.3 mmol/L (ref 3.5–5.1)
Potassium: 4.5 mmol/L (ref 3.5–5.1)
Potassium: 3.6 mmol/L (ref 3.5–5.1)
Sodium: 137 mmol/L (ref 135–145)
Sodium: 135 mmol/L (ref 135–145)
Sodium: 135 mmol/L (ref 135–145)
Sodium: 135 mmol/L (ref 135–145)
Sodium: 135 mmol/L (ref 135–145)
Sodium: 135 mmol/L (ref 135–145)
TCO2: 27 mmol/L (ref 22–32)
TCO2: 26 mmol/L (ref 22–32)
TCO2: 27 mmol/L (ref 22–32)
TCO2: 25 mmol/L (ref 22–32)
TCO2: 26 mmol/L (ref 22–32)
TCO2: 23 mmol/L (ref 22–32)

## 2023-05-15 LAB — BASIC METABOLIC PANEL
Anion gap: 8 (ref 5–15)
BUN: 10 mg/dL (ref 6–20)
CO2: 21 mmol/L — ABNORMAL LOW (ref 22–32)
Calcium: 7.9 mg/dL — ABNORMAL LOW (ref 8.9–10.3)
Chloride: 105 mmol/L (ref 98–111)
Creatinine, Ser: 0.76 mg/dL (ref 0.44–1.00)
GFR, Estimated: >60 mL/min (ref >60)
Glucose, Bld: 146 mg/dL — ABNORMAL HIGH (ref 70–99)
Potassium: 4.8 mmol/L (ref 3.5–5.1)
Sodium: 134 mmol/L — ABNORMAL LOW (ref 135–145)

## 2023-05-15 LAB — GLUCOSE, CAPILLARY
Glucose-Capillary: 133 mg/dL — ABNORMAL HIGH (ref 70–99)
Glucose-Capillary: 123 mg/dL — ABNORMAL HIGH (ref 70–99)
Glucose-Capillary: 124 mg/dL — ABNORMAL HIGH (ref 70–99)
Glucose-Capillary: 129 mg/dL — ABNORMAL HIGH (ref 70–99)
Glucose-Capillary: 151 mg/dL — ABNORMAL HIGH (ref 70–99)
Glucose-Capillary: 155 mg/dL — ABNORMAL HIGH (ref 70–99)
Glucose-Capillary: 147 mg/dL — ABNORMAL HIGH (ref 70–99)
Glucose-Capillary: 142 mg/dL — ABNORMAL HIGH (ref 70–99)
Glucose-Capillary: 160 mg/dL — ABNORMAL HIGH (ref 70–99)
Glucose-Capillary: 148 mg/dL — ABNORMAL HIGH (ref 70–99)

## 2023-05-15 LAB — HEMOGLOBIN AND HEMATOCRIT, BLOOD
HCT: 27.9 % — ABNORMAL LOW (ref 36.0–46.0)
Hemoglobin: 9.6 g/dL — ABNORMAL LOW (ref 12.0–15.0)

## 2023-05-15 LAB — PROTIME-INR
INR: 1.6 — ABNORMAL HIGH (ref 0.8–1.2)
Prothrombin Time: 19.1 seconds — ABNORMAL HIGH (ref 11.4–15.2)

## 2023-05-15 LAB — MAGNESIUM: Magnesium: 2.9 mg/dL — ABNORMAL HIGH (ref 1.7–2.4)

## 2023-05-15 LAB — PLATELET COUNT: Platelets: 204 10*3/uL (ref 150–400)

## 2023-05-15 LAB — POCT PREGNANCY, URINE: Preg Test, Ur: NEGATIVE

## 2023-05-15 LAB — APTT: aPTT: 35 seconds (ref 24–36)

## 2023-05-15 SURGERY — REPLACEMENT, AORTIC VALVE, OPEN
Anesthesia: General | Site: Chest

## 2023-05-15 MED ORDER — MIDAZOLAM HCL (PF) 10 MG/2ML IJ SOLN
INTRAMUSCULAR | Status: AC
  Filled 2023-05-15: qty 2

## 2023-05-15 MED ORDER — BISACODYL 5 MG PO TBEC: 10.0000 mg | DELAYED_RELEASE_TABLET | Freq: Every day | ORAL | Status: DC

## 2023-05-15 MED ORDER — ACETAMINOPHEN 160 MG/5ML PO SOLN
650.0000 mg | Freq: Once | ORAL | Status: AC
  Administered 2023-05-15: 650 mg
  Filled 2023-05-15: qty 20.3

## 2023-05-15 MED ORDER — PROPOFOL 10 MG/ML IV BOLUS
INTRAVENOUS | Status: DC | PRN
  Administered 2023-05-15: 30 mg via INTRAVENOUS

## 2023-05-15 MED ORDER — ACETAMINOPHEN 500 MG PO TABS
1000.0000 mg | ORAL_TABLET | Freq: Four times a day (QID) | ORAL | Status: AC
  Administered 2023-05-16 – 2023-05-20 (×16): 1000 mg via ORAL
  Filled 2023-05-15 (×17): qty 2

## 2023-05-15 MED ORDER — TRAMADOL HCL 50 MG PO TABS: 50.0000 mg | ORAL_TABLET | ORAL | Status: DC | PRN

## 2023-05-15 MED ORDER — ASPIRIN 325 MG PO TBEC: 325.0000 mg | DELAYED_RELEASE_TABLET | Freq: Every day | ORAL | Status: DC

## 2023-05-15 MED ORDER — CHLORHEXIDINE GLUCONATE CLOTH 2 % EX PADS
6.0000 | MEDICATED_PAD | Freq: Every day | CUTANEOUS | Status: DC
  Administered 2023-05-15 – 2023-05-17 (×3): 6 via TOPICAL

## 2023-05-15 MED ORDER — CHLORHEXIDINE GLUCONATE 0.12 % MT SOLN
15.0000 mL | OROMUCOSAL | Status: AC
  Administered 2023-05-15: 15 mL via OROMUCOSAL
  Filled 2023-05-15: qty 15

## 2023-05-15 MED ORDER — SODIUM CHLORIDE 0.9 % IV SOLN: 250.0000 mL | INTRAVENOUS | Status: DC

## 2023-05-15 MED ORDER — SODIUM CHLORIDE 0.9% FLUSH: 3.0000 mL | INTRAVENOUS | Status: DC | PRN

## 2023-05-15 MED ORDER — VANCOMYCIN HCL IN DEXTROSE 1-5 GM/200ML-% IV SOLN
1000.0000 mg | Freq: Once | INTRAVENOUS | Status: AC
  Administered 2023-05-15: 1000 mg via INTRAVENOUS
  Filled 2023-05-15: qty 200

## 2023-05-15 MED ORDER — ALBUMIN HUMAN 5 % IV SOLN
250.0000 mL | INTRAVENOUS | Status: DC | PRN
  Administered 2023-05-15 (×2): 12.5 g via INTRAVENOUS
  Filled 2023-05-15 (×2): qty 250

## 2023-05-15 MED ORDER — THROMBIN 20000 UNITS EX SOLR
OROMUCOSAL | Status: DC | PRN
  Administered 2023-05-15 (×3): 4 mL via TOPICAL

## 2023-05-15 MED ORDER — CEFAZOLIN SODIUM-DEXTROSE 2-4 GM/100ML-% IV SOLN
2.0000 g | Freq: Three times a day (TID) | INTRAVENOUS | Status: AC
  Administered 2023-05-15 – 2023-05-17 (×6): 2 g via INTRAVENOUS
  Filled 2023-05-15 (×6): qty 100

## 2023-05-15 MED ORDER — SODIUM CHLORIDE 0.9% FLUSH: 10.0000 mL | INTRAVENOUS | Status: DC | PRN

## 2023-05-15 MED ORDER — METOPROLOL TARTRATE 12.5 MG HALF TABLET: 12.5000 mg | ORAL_TABLET | Freq: Once | ORAL | Status: DC

## 2023-05-15 MED ORDER — ACETAMINOPHEN 160 MG/5ML PO SOLN: 1000.0000 mg | Freq: Four times a day (QID) | ORAL | Status: AC

## 2023-05-15 MED ORDER — CHLORHEXIDINE GLUCONATE 0.12 % MT SOLN
OROMUCOSAL | Status: AC
  Administered 2023-05-15: 15 mL via OROMUCOSAL
  Filled 2023-05-15: qty 15

## 2023-05-15 MED ORDER — DOCUSATE SODIUM 100 MG PO CAPS: 200.0000 mg | ORAL_CAPSULE | Freq: Every day | ORAL | Status: DC

## 2023-05-15 MED ORDER — ASPIRIN 81 MG PO CHEW: 324.0000 mg | CHEWABLE_TABLET | Freq: Every day | ORAL | Status: DC

## 2023-05-15 MED ORDER — SUCCINYLCHOLINE CHLORIDE 200 MG/10ML IV SOSY
PREFILLED_SYRINGE | INTRAVENOUS | Status: AC
  Filled 2023-05-15: qty 10

## 2023-05-15 MED ORDER — ORAL CARE MOUTH RINSE: 15.0000 mL | Freq: Once | OROMUCOSAL | Status: AC

## 2023-05-15 MED ORDER — ASPIRIN 81 MG PO CHEW
324.0000 mg | CHEWABLE_TABLET | Freq: Once | ORAL | Status: AC
  Administered 2023-05-15: 324 mg via ORAL
  Filled 2023-05-15: qty 4

## 2023-05-15 MED ORDER — ORAL CARE MOUTH RINSE
15.0000 mL | OROMUCOSAL | Status: DC
  Administered 2023-05-15 (×2): 15 mL via OROMUCOSAL

## 2023-05-15 MED ORDER — PROTAMINE SULFATE 10 MG/ML IV SOLN
INTRAVENOUS | Status: AC
  Filled 2023-05-15: qty 50

## 2023-05-15 MED ORDER — HEPARIN SODIUM (PORCINE) 1000 UNIT/ML IJ SOLN
INTRAMUSCULAR | Status: AC
  Filled 2023-05-15: qty 1

## 2023-05-15 MED ORDER — CHLORHEXIDINE GLUCONATE CLOTH 2 % EX PADS
6.0000 | MEDICATED_PAD | Freq: Every day | CUTANEOUS | Status: DC
  Administered 2023-05-17: 6 via TOPICAL

## 2023-05-15 MED ORDER — HEPARIN SODIUM (PORCINE) 1000 UNIT/ML IJ SOLN
INTRAMUSCULAR | Status: DC | PRN
  Administered 2023-05-15: 40000 [IU] via INTRAVENOUS

## 2023-05-15 MED ORDER — PHENYLEPHRINE HCL-NACL 20-0.9 MG/250ML-% IV SOLN: 0.0000 ug/min | INTRAVENOUS | Status: DC

## 2023-05-15 MED ORDER — METOPROLOL TARTRATE 5 MG/5ML IV SOLN
2.5000 mg | INTRAVENOUS | Status: DC | PRN
  Administered 2023-05-15: 2.5 mg via INTRAVENOUS
  Filled 2023-05-15: qty 5

## 2023-05-15 MED ORDER — SODIUM CHLORIDE 0.9% FLUSH
3.0000 mL | Freq: Two times a day (BID) | INTRAVENOUS | Status: DC
  Administered 2023-05-16 – 2023-05-17 (×3): 3 mL via INTRAVENOUS

## 2023-05-15 MED ORDER — ORAL CARE MOUTH RINSE: 15.0000 mL | OROMUCOSAL | Status: DC | PRN

## 2023-05-15 MED ORDER — INSULIN REGULAR(HUMAN) IN NACL 100-0.9 UT/100ML-% IV SOLN: INTRAVENOUS | Status: DC

## 2023-05-15 MED ORDER — PANTOPRAZOLE SODIUM 40 MG IV SOLR
40.0000 mg | Freq: Every day | INTRAVENOUS | Status: AC
  Administered 2023-05-15 – 2023-05-16 (×2): 40 mg via INTRAVENOUS
  Filled 2023-05-15 (×2): qty 10

## 2023-05-15 MED ORDER — METOCLOPRAMIDE HCL 5 MG/ML IJ SOLN
10.0000 mg | Freq: Four times a day (QID) | INTRAMUSCULAR | Status: DC
  Administered 2023-05-15 – 2023-05-17 (×9): 10 mg via INTRAVENOUS
  Filled 2023-05-15 (×9): qty 2

## 2023-05-15 MED ORDER — SODIUM BICARBONATE 8.4 % IV SOLN
50.0000 meq | Freq: Once | INTRAVENOUS | Status: AC
  Administered 2023-05-15: 50 meq via INTRAVENOUS

## 2023-05-15 MED ORDER — CHLORHEXIDINE GLUCONATE 0.12 % MT SOLN: 15.0000 mL | Freq: Once | OROMUCOSAL | Status: AC

## 2023-05-15 MED ORDER — MAGNESIUM SULFATE 4 GM/100ML IV SOLN
4.0000 g | Freq: Once | INTRAVENOUS | Status: AC
  Administered 2023-05-15: 4 g via INTRAVENOUS
  Filled 2023-05-15: qty 100

## 2023-05-15 MED ORDER — METOPROLOL TARTRATE 25 MG/10 ML ORAL SUSPENSION
12.5000 mg | Freq: Two times a day (BID) | ORAL | Status: DC
  Administered 2023-05-15: 12.5 mg
  Filled 2023-05-15: qty 10

## 2023-05-15 MED ORDER — DEXTROSE 50 % IV SOLN: 0.0000 mL | INTRAVENOUS | Status: DC | PRN

## 2023-05-15 MED ORDER — METOPROLOL TARTRATE 12.5 MG HALF TABLET: 12.5000 mg | ORAL_TABLET | Freq: Two times a day (BID) | ORAL | Status: DC

## 2023-05-15 MED ORDER — CHLORHEXIDINE GLUCONATE 4 % EX SOLN: 30.0000 mL | CUTANEOUS | Status: DC

## 2023-05-15 MED ORDER — FENTANYL CITRATE (PF) 250 MCG/5ML IJ SOLN
INTRAMUSCULAR | Status: AC
  Filled 2023-05-15: qty 5

## 2023-05-15 MED ORDER — PLASMA-LYTE A IV SOLN: INTRAVENOUS | Status: DC | PRN

## 2023-05-15 MED ORDER — SODIUM CHLORIDE 0.45 % IV SOLN: INTRAVENOUS | Status: DC | PRN

## 2023-05-15 MED ORDER — THROMBIN (RECOMBINANT) 20000 UNITS EX SOLR
CUTANEOUS | Status: AC
  Filled 2023-05-15: qty 20000

## 2023-05-15 MED ORDER — BISACODYL 10 MG RE SUPP: 10.0000 mg | Freq: Every day | RECTAL | Status: DC

## 2023-05-15 MED ORDER — LACTATED RINGERS IV SOLN: INTRAVENOUS | Status: DC

## 2023-05-15 MED ORDER — LACTATED RINGERS IV SOLN: INTRAVENOUS | Status: DC | PRN

## 2023-05-15 MED ORDER — PHENYLEPHRINE 80 MCG/ML (10ML) SYRINGE FOR IV PUSH (FOR BLOOD PRESSURE SUPPORT)
PREFILLED_SYRINGE | INTRAVENOUS | Status: DC | PRN
  Administered 2023-05-15: 40 ug via INTRAVENOUS
  Administered 2023-05-15: 160 ug via INTRAVENOUS
  Administered 2023-05-15: 80 ug via INTRAVENOUS
  Administered 2023-05-15: 20 ug via INTRAVENOUS
  Administered 2023-05-15 (×2): 80 ug via INTRAVENOUS

## 2023-05-15 MED ORDER — PANTOPRAZOLE SODIUM 40 MG PO TBEC
40.0000 mg | DELAYED_RELEASE_TABLET | Freq: Every day | ORAL | Status: DC
  Administered 2023-05-18 – 2023-05-23 (×6): 40 mg via ORAL
  Filled 2023-05-15 (×6): qty 1

## 2023-05-15 MED ORDER — PROPOFOL 10 MG/ML IV BOLUS
INTRAVENOUS | Status: AC
  Filled 2023-05-15: qty 20

## 2023-05-15 MED ORDER — HEMOSTATIC AGENTS (NO CHARGE) OPTIME
TOPICAL | Status: DC | PRN
  Administered 2023-05-15: 1 via TOPICAL

## 2023-05-15 MED ORDER — ROCURONIUM BROMIDE 10 MG/ML (PF) SYRINGE
PREFILLED_SYRINGE | INTRAVENOUS | Status: AC
  Filled 2023-05-15: qty 10

## 2023-05-15 MED ORDER — SODIUM CHLORIDE 0.9% FLUSH
10.0000 mL | Freq: Two times a day (BID) | INTRAVENOUS | Status: DC
  Administered 2023-05-15 – 2023-05-17 (×5): 10 mL

## 2023-05-15 MED ORDER — LIDOCAINE 2% (20 MG/ML) 5 ML SYRINGE
INTRAMUSCULAR | Status: AC
  Filled 2023-05-15: qty 5

## 2023-05-15 MED ORDER — DEXMEDETOMIDINE HCL IN NACL 400 MCG/100ML IV SOLN
0.0000 ug/kg/h | INTRAVENOUS | Status: DC
  Administered 2023-05-15: 0.2 ug/kg/h via INTRAVENOUS
  Filled 2023-05-15: qty 100

## 2023-05-15 MED ORDER — MIDAZOLAM HCL (PF) 5 MG/ML IJ SOLN
INTRAMUSCULAR | Status: DC | PRN
  Administered 2023-05-15 (×2): 2 mg via INTRAVENOUS
  Administered 2023-05-15 (×4): 1 mg via INTRAVENOUS

## 2023-05-15 MED ORDER — POTASSIUM CHLORIDE 10 MEQ/50ML IV SOLN
10.0000 meq | INTRAVENOUS | Status: AC
  Administered 2023-05-15 (×3): 10 meq via INTRAVENOUS

## 2023-05-15 MED ORDER — ROCURONIUM BROMIDE 10 MG/ML (PF) SYRINGE
PREFILLED_SYRINGE | INTRAVENOUS | Status: DC | PRN
  Administered 2023-05-15: 40 mg via INTRAVENOUS
  Administered 2023-05-15 (×2): 50 mg via INTRAVENOUS
  Administered 2023-05-15: 60 mg via INTRAVENOUS

## 2023-05-15 MED ORDER — FENTANYL CITRATE (PF) 250 MCG/5ML IJ SOLN
INTRAMUSCULAR | Status: DC | PRN
  Administered 2023-05-15 (×3): 50 ug via INTRAVENOUS
  Administered 2023-05-15: 100 ug via INTRAVENOUS
  Administered 2023-05-15 (×2): 50 ug via INTRAVENOUS
  Administered 2023-05-15: 650 ug via INTRAVENOUS
  Administered 2023-05-15: 100 ug via INTRAVENOUS

## 2023-05-15 MED ORDER — ~~LOC~~ CARDIAC SURGERY, PATIENT & FAMILY EDUCATION
Freq: Once | Status: DC
  Filled 2023-05-15: qty 1

## 2023-05-15 MED ORDER — 0.9 % SODIUM CHLORIDE (POUR BTL) OPTIME
TOPICAL | Status: DC | PRN
  Administered 2023-05-15: 6000 mL

## 2023-05-15 MED ORDER — CHLORHEXIDINE GLUCONATE 0.12 % MT SOLN: 15.0000 mL | Freq: Once | OROMUCOSAL | Status: DC

## 2023-05-15 MED ORDER — ONDANSETRON HCL 4 MG/2ML IJ SOLN
4.0000 mg | Freq: Four times a day (QID) | INTRAMUSCULAR | Status: DC | PRN
  Administered 2023-05-15 – 2023-05-16 (×3): 4 mg via INTRAVENOUS
  Filled 2023-05-15 (×3): qty 2

## 2023-05-15 MED ORDER — ALBUMIN HUMAN 5 % IV SOLN: INTRAVENOUS | Status: DC | PRN

## 2023-05-15 MED ORDER — CALCIUM CHLORIDE 10 % IV SOLN
INTRAVENOUS | Status: DC | PRN
  Administered 2023-05-15: 200 mg via INTRAVENOUS
  Administered 2023-05-15: 500 mg via INTRAVENOUS

## 2023-05-15 MED ORDER — NITROGLYCERIN 0.2 MG/ML ON CALL CATH LAB
INTRAVENOUS | Status: DC | PRN
  Administered 2023-05-15 (×2): 20 ug via INTRAVENOUS

## 2023-05-15 MED ORDER — MORPHINE SULFATE (PF) 2 MG/ML IV SOLN
1.0000 mg | INTRAVENOUS | Status: DC | PRN
  Administered 2023-05-15: 4 mg via INTRAVENOUS
  Administered 2023-05-15 – 2023-05-16 (×2): 2 mg via INTRAVENOUS
  Administered 2023-05-16: 4 mg via INTRAVENOUS
  Administered 2023-05-17: 2 mg via INTRAVENOUS
  Filled 2023-05-15: qty 1
  Filled 2023-05-15: qty 2
  Filled 2023-05-15 (×2): qty 1
  Filled 2023-05-15: qty 2

## 2023-05-15 MED ORDER — SODIUM CHLORIDE 0.9 % IV SOLN: INTRAVENOUS | Status: DC

## 2023-05-15 MED ORDER — OXYCODONE HCL 5 MG PO TABS
5.0000 mg | ORAL_TABLET | ORAL | Status: DC | PRN
  Administered 2023-05-15 – 2023-05-16 (×3): 10 mg via ORAL
  Filled 2023-05-15 (×3): qty 2

## 2023-05-15 MED ORDER — PROTAMINE SULFATE 10 MG/ML IV SOLN
INTRAVENOUS | Status: DC | PRN
  Administered 2023-05-15: 310 mg via INTRAVENOUS
  Administered 2023-05-15: 40 mg via INTRAVENOUS

## 2023-05-15 MED ORDER — NITROGLYCERIN IN D5W 200-5 MCG/ML-% IV SOLN
0.0000 ug/min | INTRAVENOUS | Status: DC
  Administered 2023-05-15: 5 ug/min via INTRAVENOUS
  Filled 2023-05-15: qty 250

## 2023-05-15 MED ORDER — MIDAZOLAM HCL 2 MG/2ML IJ SOLN
2.0000 mg | INTRAMUSCULAR | Status: DC | PRN
  Administered 2023-05-15: 2 mg via INTRAVENOUS
  Filled 2023-05-15: qty 2

## 2023-05-15 MED FILL — Potassium Chloride Inj 2 mEq/ML: INTRAVENOUS | Qty: 40 | Status: AC

## 2023-05-15 MED FILL — Heparin Sodium (Porcine) Inj 1000 Unit/ML: Qty: 1000 | Status: AC

## 2023-05-15 MED FILL — Lidocaine HCl Local Preservative Free (PF) Inj 2%: INTRAMUSCULAR | Qty: 14 | Status: AC

## 2023-05-15 SURGICAL SUPPLY — 78 items
ADAPTER CARDIO PERF ANTE/RETRO (ADAPTER) ×2 IMPLANT
ADPR PRFSN 84XANTGRD RTRGD (ADAPTER) ×2
BAG DECANTER FOR FLEXI CONT (MISCELLANEOUS) ×2 IMPLANT
BLADE CLIPPER SURG (BLADE) ×2 IMPLANT
BLADE STERNUM SYSTEM 6 (BLADE) ×2 IMPLANT
BLADE SURG 15 STRL LF DISP TIS (BLADE) ×2 IMPLANT
BLADE SURG 15 STRL SS (BLADE) ×2
CANISTER SUCT 3000ML PPV (MISCELLANEOUS) ×2 IMPLANT
CANNULA GUNDRY RCSP 15FR (MISCELLANEOUS) ×2 IMPLANT
CATH HEART VENT LEFT (CATHETERS) ×2 IMPLANT
CATH ROBINSON RED A/P 18FR (CATHETERS) ×6 IMPLANT
CATH THORACIC 36FR (CATHETERS) ×2 IMPLANT
CATH THORACIC 36FR RT ANG (CATHETERS) ×2 IMPLANT
CNTNR URN SCR LID CUP LEK RST (MISCELLANEOUS) ×2 IMPLANT
CONT SPEC 4OZ STRL OR WHT (MISCELLANEOUS) ×2
CONTAINER PROTECT SURGISLUSH (MISCELLANEOUS) ×4 IMPLANT
COVER SURGICAL LIGHT HANDLE (MISCELLANEOUS) ×2 IMPLANT
DEVICE SUT CK QUICK LOAD MINI (Prosthesis & Implant Heart) IMPLANT
DRAPE CARDIOVASCULAR INCISE (DRAPES) ×2
DRAPE SRG 135X102X78XABS (DRAPES) ×2 IMPLANT
DRAPE WARM FLUID 44X44 (DRAPES) ×2 IMPLANT
DRSG COVADERM 4X14 (GAUZE/BANDAGES/DRESSINGS) ×2 IMPLANT
ELECT CAUTERY BLADE 6.4 (BLADE) ×2 IMPLANT
ELECT REM PT RETURN 9FT ADLT (ELECTROSURGICAL) ×4
ELECTRODE REM PT RTRN 9FT ADLT (ELECTROSURGICAL) ×4 IMPLANT
FELT TEFLON 1X6 (MISCELLANEOUS) ×4 IMPLANT
GAUZE 4X4 16PLY ~~LOC~~+RFID DBL (SPONGE) ×2 IMPLANT
GAUZE SPONGE 4X4 12PLY STRL (GAUZE/BANDAGES/DRESSINGS) ×2 IMPLANT
GAUZE SPONGE 4X4 12PLY STRL LF (GAUZE/BANDAGES/DRESSINGS) IMPLANT
GLOVE BIO SURGEON STRL SZ 6 (GLOVE) IMPLANT
GLOVE BIO SURGEON STRL SZ 6.5 (GLOVE) IMPLANT
GLOVE BIO SURGEON STRL SZ7 (GLOVE) IMPLANT
GLOVE BIO SURGEON STRL SZ7.5 (GLOVE) IMPLANT
GLOVE SURG MICRO LTX SZ7 (GLOVE) ×4 IMPLANT
GOWN STRL REUS W/ TWL LRG LVL3 (GOWN DISPOSABLE) ×8 IMPLANT
GOWN STRL REUS W/ TWL XL LVL3 (GOWN DISPOSABLE) ×2 IMPLANT
GOWN STRL REUS W/TWL LRG LVL3 (GOWN DISPOSABLE) ×10
GOWN STRL REUS W/TWL XL LVL3 (GOWN DISPOSABLE) ×2
HEMOSTAT POWDER SURGIFOAM 1G (HEMOSTASIS) ×6 IMPLANT
HEMOSTAT SURGICEL 2X14 (HEMOSTASIS) ×2 IMPLANT
KIT BASIN OR (CUSTOM PROCEDURE TRAY) ×2 IMPLANT
KIT CATH CPB BARTLE (MISCELLANEOUS) ×2 IMPLANT
KIT SUCTION CATH 14FR (SUCTIONS) ×2 IMPLANT
KIT SUT CK MINI COMBO 4X17 (Prosthesis & Implant Heart) IMPLANT
KIT TURNOVER KIT B (KITS) ×2 IMPLANT
LINE VENT (MISCELLANEOUS) IMPLANT
NS IRRIG 1000ML POUR BTL (IV SOLUTION) ×12 IMPLANT
PACK E OPEN HEART (SUTURE) ×2 IMPLANT
PACK OPEN HEART (CUSTOM PROCEDURE TRAY) ×2 IMPLANT
PAD ARMBOARD 7.5X6 YLW CONV (MISCELLANEOUS) ×4 IMPLANT
POSITIONER HEAD DONUT 9IN (MISCELLANEOUS) ×2 IMPLANT
SET MPS 3-ND DEL (MISCELLANEOUS) IMPLANT
SPONGE T-LAP 18X18 ~~LOC~~+RFID (SPONGE) ×8 IMPLANT
SPONGE T-LAP 4X18 ~~LOC~~+RFID (SPONGE) ×2 IMPLANT
SUT BONE WAX W31G (SUTURE) ×2 IMPLANT
SUT EB EXC GRN/WHT 2-0 V-5 (SUTURE) ×4 IMPLANT
SUT ETHIBON EXCEL 2-0 V-5 (SUTURE) IMPLANT
SUT ETHIBOND V-5 VALVE (SUTURE) IMPLANT
SUT PROLENE 3 0 SH DA (SUTURE) IMPLANT
SUT PROLENE 3 0 SH1 36 (SUTURE) ×2 IMPLANT
SUT PROLENE 4 0 RB 1 (SUTURE) ×8
SUT PROLENE 4-0 RB1 .5 CRCL 36 (SUTURE) ×6 IMPLANT
SUT SILK 2 0 SH CR/8 (SUTURE) IMPLANT
SUT STEEL 6MS V (SUTURE) IMPLANT
SUT STEEL SZ 6 DBL 3X14 BALL (SUTURE) IMPLANT
SUT VIC AB 1 CTX 36 (SUTURE) ×4
SUT VIC AB 1 CTX36XBRD ANBCTR (SUTURE) ×4 IMPLANT
SYSTEM SAHARA CHEST DRAIN ATS (WOUND CARE) ×2 IMPLANT
TAPE CLOTH SURG 4X10 WHT LF (GAUZE/BANDAGES/DRESSINGS) IMPLANT
TAPE PAPER 2X10 WHT MICROPORE (GAUZE/BANDAGES/DRESSINGS) IMPLANT
TOWEL GREEN STERILE (TOWEL DISPOSABLE) ×2 IMPLANT
TOWEL GREEN STERILE FF (TOWEL DISPOSABLE) ×2 IMPLANT
TRAY FOLEY SLVR 16FR TEMP STAT (SET/KITS/TRAYS/PACK) ×2 IMPLANT
TUBE SUCTION CARDIAC 10FR (CANNULA) IMPLANT
UNDERPAD 30X36 HEAVY ABSORB (UNDERPADS AND DIAPERS) ×2 IMPLANT
VALVE REGENT 21MM (Valve) IMPLANT
VENT LEFT HEART 12002 (CATHETERS) ×2
WATER STERILE IRR 1000ML POUR (IV SOLUTION) ×4 IMPLANT

## 2023-05-15 NOTE — Anesthesia Procedure Notes (Addendum)
Procedure Name: Intubation Date/Time: 05/15/2023 7:56 AM  Performed by: Darryl Nestle, CRNAPre-anesthesia Checklist: Patient identified, Emergency Drugs available, Suction available and Patient being monitored Patient Re-evaluated:Patient Re-evaluated prior to induction Oxygen Delivery Method: Circle system utilized Preoxygenation: Pre-oxygenation with 100% oxygen Induction Type: IV induction Ventilation: Mask ventilation without difficulty Laryngoscope Size: Glidescope Grade View: Grade I Tube type: Oral Tube size: 8.0 mm Number of attempts: 1 Airway Equipment and Method: Oral airway, Rigid stylet and Video-laryngoscopy Placement Confirmation: ETT inserted through vocal cords under direct vision, positive ETCO2 and breath sounds checked- equal and bilateral Secured at: 22 cm Tube secured with: Tape Dental Injury: Teeth and Oropharynx as per pre-operative assessment  Comments: Performed by sam foster srna

## 2023-05-15 NOTE — Op Note (Signed)
CARDIOVASCULAR SURGERY OPERATIVE NOTE  05/15/2023 Brianna Turner [id number]  Surgeon:  Alleen Borne, MD  First Assistant: Aloha Gell, PA-C:  An experienced assistant was required given the complexity of this surgery and the standard of surgical care. The assistant was needed for exposure, dissection, suctioning, retraction of delicate tissues and sutures, instrument exchange and for overall help during this procedure.    Preoperative Diagnosis:  Severe bicuspid aortic stenosis   Postoperative Diagnosis:  Same   Procedure:  Median Sternotomy Extracorporeal circulation 3.   Aortic valve replacement using a 21 mm St. Jude Regent mechanical valve.  Anesthesia:  General Endotracheal   Clinical History/Surgical Indication:  This 47 year old woman has stage D, symptomatic, critical bicuspid aortic valve stenosis.  She has had progressive exertional fatigue and shortness of breath as well as episodes of dizziness and 1 episode of syncope with NYHA class III symptoms.  I agree that aortic valve replacement is indicated in this patient for relief of her symptoms and to prevent left ventricular deterioration and sudden death.   I discussed the operative procedure of aortic valve replacement and possible aortic root replacement with the patient and family including alternatives, benefits and risks; including but not limited to bleeding, blood transfusion, infection, stroke, myocardial infarction, graft failure, heart block requiring a permanent pacemaker, organ dysfunction, and death.  We also discussed the prosthetic valve alternatives including mechanical and bioprosthetic valves.  Given her young age a mechanical valve would probably be best for her since she has no contraindications to anticoagulation. She is in agreement with that.  Brianna Turner understands and agrees to proceed.      Preparation:  The patient was seen in the preoperative holding area and the correct patient,  correct operation were confirmed with the patient after reviewing the medical record and catheterization. The consent was signed by me. Preoperative antibiotics were given. A pulmonary arterial line and radial arterial line were placed by the anesthesia team. The patient was taken back to the operating room and positioned supine on the operating room table. After being placed under general endotracheal anesthesia by the anesthesia team a foley catheter was placed. The neck, chest, abdomen, and both legs were prepped with betadine soap and solution and draped in the usual sterile manner. A surgical time-out was taken and the correct patient and operative procedure were confirmed with the nursing and anesthesia staff.   Pre-bypass TEE:   Complete TEE assessment was performed by Dr. Jairo Ben. This showed critical bicuspid aortic stenosis with a mean gradient over 70 mm Hg. LV systolic function was normal with severe concentric LVH, trivial MR.    Post-bypass TEE:   Normal functioning prosthetic aortic valve with no perivalvular leak. Mean gradient 6 mm Hg, peak 14 mm Hg. Left ventricular function preserved. Trivival mitral regurgitation.    Cardiopulmonary Bypass:  A median sternotomy was performed. The pericardium was opened in the midline. Right ventricular function appeared normal. The ascending aorta was of normal size and had no palpable plaque. There were no contraindications to aortic cannulation or cross-clamping. The patient was fully systemically heparinized and the ACT was maintained > 400 sec. The proximal aortic arch was cannulated with a 20 F aortic cannula for arterial inflow. Venous cannulation was performed via the right atrial appendage using a two-staged venous cannula. An antegrade cardioplegia/vent cannula was inserted into the mid-ascending aorta. A left ventricular vent was placed via the right superior pulmonary vein. A retrograde cardioplegia cannnula was placed into  the coronary sinus via the right atrium. Aortic occlusion was performed with a single cross-clamp. Systemic cooling to 32 degrees Centigrade and topical cooling of the heart with iced saline were used. Cold KBC cardioplegia was used to induce diastolic arrest and then cold retrograde KBC cardioplegia was given at about 60 minute intervals throughout the period of arrest to maintain myocardial temperature at or below 10 degrees centigrade. A temperature probe was inserted into the interventricular septum and an insulating pad was placed in the pericardium. Carbon dioxide was insufflated into the pericardium at 5L/min throughout the procedure to minimize intracardiac air.   Aortic Valve Replacement: I was assisted by Aloha Gell, PA-C for the entire valve replacement.   A transverse aortotomy was performed 1 cm above the STJ. The native valve was a type 1 bicuspid with fusion of the left and right cusps with severely calcified leaflets and moderate annular calcification. The ostia of the coronary arteries were in normal position and were not obstructed. The native valve leaflets were excised and the annulus was decalcified with rongeurs. Care was taken to remove all particulate debris. The left ventricle was directly inspected for debris and then irrigated with ice saline solution. The annulus was sized and a size 21 mm St. Jude Regent mechanical valve was chosen. The model number was [id number] and the serial number was [id number]. While the valve was being prepared 2-0 Ethibond pledgeted horizontal mattress sutures were placed around the annulus with the pledgets in a sub-annular position. The sutures were placed through the sewing ring and the valve lowered into place. The sutures were tied using CorKnots. The valve seated nicely and the coronary ostia were not obstructed. The prosthetic valve leaflets moved normally and there was no sub-valvular obstruction. The aortotomy was closed using 4-0 Prolene suture  in 2 layers with felt strips to reinforce the closure.  Completion:  The patient was rewarmed to 37 degrees Centigrade. A dose of warm reanimation cardioplegia was given. De-airing maneuvers were performed and the head placed in trendelenburg position. The crossclamp was removed with a time of 129 minutes. There was spontaneous return of sinus rhythm. The aortotomy was checked for hemostasis. Two temporary epicardial pacing wires were placed on the right atrium and two on the right ventricle. The left ventricular vent and retrograde cardioplegia cannulas were removed. The patient was weaned from CPB without difficulty on no inotropes. CPB time was 169 minutes. Cardiac output was 5 LPM. Heparin was fully reversed with protamine and the aortic and venous cannulas removed. Hemostasis was achieved. Mediastinal drainage tubes were placed. The sternum was closed with double #6 stainless steel wires. The fascia was closed with continuous # 1 vicryl suture. The subcutaneous tissue was closed with 2-0 vicryl continuous suture. The skin was closed with 3-0 vicryl subcuticular suture. All sponge, needle, and instrument counts were reported correct at the end of the case. Dry sterile dressings were placed over the incisions and around the chest tubes which were connected to pleurevac suction. The patient was then transported to the surgical intensive care unit in  stable condition.

## 2023-05-15 NOTE — Discharge Instructions (Signed)
Discharge Instructions:  1. You may shower, please wash incisions daily with soap and water and keep dry.  If you wish to cover wounds with dressing you may do so but please keep clean and change daily.  No tub baths or swimming until incisions have completely healed.  If your incisions become red or develop any drainage please call our office at [phone]  2. No Driving until cleared by Dr. Vivi Martens office and you are no longer using narcotic pain medications  3. Monitor your weight daily.. Please use the same scale and weigh at same time... If you gain 5-10 lbs in 48 hours with associated lower extremity swelling, please contact our office at [phone]  4. Fever of 101.5 for at least 24 hours with no source, please contact our office at [phone]  5. Activity- up as tolerated, please walk at least 3 times per day.  Avoid strenuous activity, no lifting, pushing, or pulling with your arms over 8-10 lbs for a minimum of 6 weeks  6. If any questions or concerns arise, please do not hesitate to contact our office at [phone]    Information on my medicine - Coumadin   (Warfarin)  Why was Coumadin prescribed for you? Coumadin was prescribed for you because you have a blood clot or a medical condition that can cause an increased risk of forming blood clots. Blood clots can cause serious health problems by blocking the flow of blood to the heart, lung, or brain. Coumadin can prevent harmful blood clots from forming. As a reminder your indication for Coumadin is:  Blood Clot Prevention after Heart Valve Surgery  What test will check on my response to Coumadin? While on Coumadin (warfarin) you will need to have an INR test regularly to ensure that your dose is keeping you in the desired range. The INR (international normalized ratio) number is calculated from the result of the laboratory test called prothrombin time (PT).  If an INR APPOINTMENT HAS NOT ALREADY BEEN MADE FOR YOU please  schedule an appointment to have this lab work done by your health care provider within 7 days. Your INR goal is usually a number between:  2 to 3 or your provider may give you a more narrow range like 2-2.5.  Ask your health care provider during an office visit what your goal INR is.  What  do you need to  know  About  COUMADIN? Take Coumadin (warfarin) exactly as prescribed by your healthcare provider about the same time each day.  DO NOT stop taking without talking to the doctor who prescribed the medication.  Stopping without other blood clot prevention medication to take the place of Coumadin may increase your risk of developing a new clot or stroke.  Get refills before you run out.  What do you do if you miss a dose? If you miss a dose, take it as soon as you remember on the same day then continue your regularly scheduled regimen the next day.  Do not take two doses of Coumadin at the same time.  Important Safety Information A possible side effect of Coumadin (Warfarin) is an increased risk of bleeding. You should call your healthcare provider right away if you experience any of the following: Bleeding from an injury or your nose that does not stop. Unusual colored urine (red or dark brown) or unusual colored stools (red or black). Unusual bruising for unknown reasons. A serious fall or if you hit your head (even if there is no  bleeding).  Some foods or medicines interact with Coumadin (warfarin) and might alter your response to warfarin. To help avoid this: Eat a balanced diet, maintaining a consistent amount of Vitamin K. Notify your provider about major diet changes you plan to make. Avoid alcohol or limit your intake to 1 drink for women and 2 drinks for men per day. (1 drink is 5 oz. wine, 12 oz. beer, or 1.5 oz. liquor.)  Make sure that ANY health care provider who prescribes medication for you knows that you are taking Coumadin (warfarin).  Also make sure the healthcare provider  who is monitoring your Coumadin knows when you have started a new medication including herbals and non-prescription products.  Coumadin (Warfarin)  Major Drug Interactions  Increased Warfarin Effect Decreased Warfarin Effect  Alcohol (large quantities) Antibiotics (esp. Septra/Bactrim, Flagyl, Cipro) Amiodarone (Cordarone) Aspirin (ASA) Cimetidine (Tagamet) Megestrol (Megace) NSAIDs (ibuprofen, naproxen, etc.) Piroxicam (Feldene) Propafenone (Rythmol SR) Propranolol (Inderal) Isoniazid (INH) Posaconazole (Noxafil) Barbiturates (Phenobarbital) Carbamazepine (Tegretol) Chlordiazepoxide (Librium) Cholestyramine (Questran) Griseofulvin Oral Contraceptives Rifampin Sucralfate (Carafate) Vitamin K   Coumadin (Warfarin) Major Herbal Interactions  Increased Warfarin Effect Decreased Warfarin Effect  Garlic Ginseng Ginkgo biloba Coenzyme Q10 Green tea St. John's wort    Coumadin (Warfarin) FOOD Interactions  Eat a consistent number of servings per week of foods HIGH in Vitamin K (1 serving =  cup)  Collards (cooked, or boiled & drained) Kale (cooked, or boiled & drained) Mustard greens (cooked, or boiled & drained) Parsley *serving size only =  cup Spinach (cooked, or boiled & drained) Swiss chard (cooked, or boiled & drained) Turnip greens (cooked, or boiled & drained)  Eat a consistent number of servings per week of foods MEDIUM-HIGH in Vitamin K (1 serving = 1 cup)  Asparagus (cooked, or boiled & drained) Broccoli (cooked, boiled & drained, or raw & chopped) Brussel sprouts (cooked, or boiled & drained) *serving size only =  cup Lettuce, raw (green leaf, endive, romaine) Spinach, raw Turnip greens, raw & chopped   These websites have more information on Coumadin (warfarin):  FailFactory.se; VeganReport.com.au;

## 2023-05-15 NOTE — Interval H&P Note (Signed)
History and Physical Interval Note:  05/15/2023 6:50 AM  Brianna Turner  has presented today for surgery, with the diagnosis of AS.  The various methods of treatment have been discussed with the patient and family. After consideration of risks, benefits and other options for treatment, the patient has consented to  Procedure(s): AORTIC VALVE REPLACEMENT (AVR) (N/A) TRANSESOPHAGEAL ECHOCARDIOGRAM (N/A) as a surgical intervention.  The patient's history has been reviewed, patient examined, no change in status, stable for surgery.  I have reviewed the patient's chart and labs.  Questions were answered to the patient's satisfaction.     Alleen Borne
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
## 2023-05-15 NOTE — Anesthesia Procedure Notes (Signed)
Arterial Line Insertion Start/End7/22/2024 6:40 AM, 05/15/2023 6:55 AM Performed by: Darryl Nestle, CRNA, CRNA  Patient location: Pre-op. Preanesthetic checklist: patient identified, IV checked, site marked, risks and benefits discussed, surgical consent, monitors and equipment checked, pre-op evaluation, timeout performed and anesthesia consent Lidocaine 1% used for infiltration Right, radial was placed Catheter size: 20 G Hand hygiene performed  and maximum sterile barriers used   Attempts: 4 Procedure performed using ultrasound guided technique. Ultrasound Notes:anatomy identified and no ultrasound evidence of intravascular and/or intraneural injection Following insertion, dressing applied and Biopatch. Post procedure assessment: normal and unchanged  Post procedure complications: unsuccessful attempts and second provider assisted. Patient tolerated the procedure well with no immediate complications.

## 2023-05-15 NOTE — Progress Notes (Signed)
Patient ID: Brianna Turner, female   DOB: 08-15-1976, 47 y.o.   MRN: [id number]  TCTS Evening Rounds:   Hemodynamically stable  CI = 2.1  Extubated. Some nausea.  Urine output good  CT output low  CBC    Component Value Date/Time   WBC 20.3 (H) 05/15/2023 1322   RBC 2.70 (L) 05/15/2023 1322   HGB 8.2 (L) 05/15/2023 1714   HCT 24.0 (L) 05/15/2023 1714   PLT 111 (L) 05/15/2023 1322   MCV 88.5 05/15/2023 1322   MCH 29.6 05/15/2023 1322   MCHC 33.5 05/15/2023 1322   RDW 12.8 05/15/2023 1322   LYMPHSABS 3.6 07/25/2008 1234   MONOABS 0.5 07/25/2008 1234   EOSABS 0.1 07/25/2008 1234   BASOSABS 0.0 07/25/2008 1234     BMET    Component Value Date/Time   NA 135 05/15/2023 1714   K 5.1 05/15/2023 1714   CL 103 05/15/2023 1204   CO2 20 (L) 05/11/2023 1104   GLUCOSE 212 (H) 05/15/2023 1204   BUN 9 05/15/2023 1204   CREATININE 0.40 (L) 05/15/2023 1204   CALCIUM 8.7 (L) 05/11/2023 1104   GFRNONAA >60 05/11/2023 1104     A/P:  Stable postop course. Continue current plans

## 2023-05-15 NOTE — Progress Notes (Signed)
  Echocardiogram Echocardiogram Transesophageal has been performed.  Delcie Roch 05/15/2023, 9:00 AM
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
## 2023-05-15 NOTE — Anesthesia Postprocedure Evaluation (Signed)
Anesthesia Post Note  Patient: Brianna Turner  Procedure(s) Performed: AORTIC VALVE REPLACEMENT (AVR) USING SJM REGENT AORTIC VALVE SIZE (Chest) TRANSESOPHAGEAL ECHOCARDIOGRAM     Patient location during evaluation: SICU Anesthesia Type: General Level of consciousness: awake and alert, patient cooperative and oriented Pain management: pain level controlled Vital Signs Assessment: post-procedure vital signs reviewed and stable Respiratory status: nonlabored ventilation, spontaneous breathing, respiratory function stable and patient connected to nasal cannula oxygen (extubated easily) Cardiovascular status: blood pressure returned to baseline and stable Postop Assessment: no apparent nausea or vomiting Anesthetic complications: no   No notable events documented.  Last Vitals:  Vitals:   05/15/23 1845 05/15/23 1900  BP:    Pulse: 80 80  Resp: 20 17  Temp: 37.3 C 37.3 C  SpO2: 100% 97%    Last Pain:  Vitals:   05/15/23 1655  TempSrc:   PainSc: 0-No pain                 Sophia Sperry,E. Chinita Schimpf

## 2023-05-15 NOTE — Anesthesia Procedure Notes (Signed)
Central Venous Catheter Insertion Performed by: Jairo Ben, MD, anesthesiologist Start/End7/22/2024 6:41 AM, 05/15/2023 6:58 AM Patient location: Pre-op. Preanesthetic checklist: patient identified, IV checked, risks and benefits discussed, surgical consent, monitors and equipment checked, pre-op evaluation, timeout performed and anesthesia consent Position: supine Lidocaine 1% used for infiltration and patient sedated Hand hygiene performed , maximum sterile barriers used  and Seldinger technique used Catheter size: 8.5 Fr PA cath was placed.Sheath introducer Swan type:thermodilution Procedure performed using ultrasound guided technique. Ultrasound Notes:anatomy identified, needle tip was noted to be adjacent to the nerve/plexus identified, no ultrasound evidence of intravascular and/or intraneural injection and image(s) printed for medical record Attempts: 1 Following insertion, line sutured, dressing applied and Biopatch. Post procedure assessment: blood return through all ports, free fluid flow and no air  Patient tolerated the procedure well with no immediate complications. Additional procedure comments: PA catheter:  Routine monitors. Timeout, sterile prep, drape, FBP R neck.  Supine position.  1% Lido local, finder and trocar RIJ 1st pass with US guidance.  Cordis placed over J wire. PA catheter in easily.  Sterile dressing applied.  Patient tolerated well, VSS.  Sandford Craze, MD.

## 2023-05-15 NOTE — Plan of Care (Signed)

## 2023-05-15 NOTE — Brief Op Note (Signed)
05/15/2023  2:59 PM  PATIENT:  Brianna Turner  47 y.o. female  PRE-OPERATIVE DIAGNOSIS:  AORTIC STENOSIS  POST-OPERATIVE DIAGNOSIS:  AORTIC STENOSIS  PROCEDURE:  Procedure(s): AORTIC VALVE REPLACEMENT (AVR) USING SJM REGENT AORTIC VALVE SIZE (N/A) TRANSESOPHAGEAL ECHOCARDIOGRAM (N/A)  SURGEON:  Surgeons and Role:    * Alleen Borne, MD - Primary  PHYSICIAN ASSISTANT: Aloha Gell PA-C  ASSISTANTS: Norton Pastel RNFA   ANESTHESIA:   general  EBL:  414 mL   BLOOD ADMINISTERED:none  DRAINS:  Mediastinal drains    LOCAL MEDICATIONS USED:  NONE  SPECIMEN:  Source of Specimen:  Aortic valve leaflets  DISPOSITION OF SPECIMEN:  PATHOLOGY  COUNTS CORRECT:  YES  DICTATION: .Dragon Dictation  PLAN OF CARE: Admit to inpatient   PATIENT DISPOSITION:  ICU - intubated and hemodynamically stable.   Delay start of Pharmacological VTE agent (>24hrs) due to surgical blood loss or risk of bleeding: yes

## 2023-05-15 NOTE — Procedures (Signed)
Extubation Procedure Note  Patient Details:   Name: Brianna Turner DOB: 09-Jul-1976 MRN: [id number]   Airway Documentation:    Vent end date: 05/15/23 Vent end time: 1727   Evaluation  O2 sats: stable throughout Complications: No apparent complications Patient did tolerate procedure well. Bilateral Breath Sounds: Clear, Diminished   Yes  Pt extubated per rapid wean protocol. Pt able to perform a NIF of -30 and VC of .7L. Pt had a positive cuff leak, able to state name and no stridor noted.   Kerri Perches 05/15/2023, 5:29 PM
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
## 2023-05-15 NOTE — Transfer of Care (Signed)
Immediate Anesthesia Transfer of Care Note  Patient: Brianna Turner  Procedure(s) Performed: AORTIC VALVE REPLACEMENT (AVR) USING SJM REGENT AORTIC VALVE SIZE (Chest) TRANSESOPHAGEAL ECHOCARDIOGRAM  Patient Location: SICU  Anesthesia Type:General  Level of Consciousness: Patient remains intubated per anesthesia plan  Airway & Oxygen Therapy: Patient remains intubated per anesthesia plan  Post-op Assessment: Report given to RN and Post -op Vital signs reviewed and stable  Post vital signs: Reviewed and stable  Last Vitals:  Vitals Value Taken Time  BP 87/54 05/15/23 1330  Temp 36.1 C 05/15/23 1330  Pulse 80 05/15/23 1330  Resp 17 05/15/23 1330  SpO2 98 % 05/15/23 1330  Vitals shown include unfiled device data.  Last Pain:  Vitals:   05/15/23 0551  TempSrc: Oral         Complications: No notable events documented.

## 2023-05-16 ENCOUNTER — Inpatient Hospital Stay (HOSPITAL_COMMUNITY): Payer: Commercial Managed Care - PPO

## 2023-05-16 ENCOUNTER — Encounter (HOSPITAL_COMMUNITY): Payer: Self-pay | Admitting: Surgery

## 2023-05-16 LAB — BASIC METABOLIC PANEL
Anion gap: 9 (ref 5–15)
Anion gap: 8 (ref 5–15)
BUN: 9 mg/dL (ref 6–20)
BUN: 8 mg/dL (ref 6–20)
CO2: 22 mmol/L (ref 22–32)
CO2: 24 mmol/L (ref 22–32)
Calcium: 7.6 mg/dL — ABNORMAL LOW (ref 8.9–10.3)
Calcium: 8 mg/dL — ABNORMAL LOW (ref 8.9–10.3)
Chloride: 102 mmol/L (ref 98–111)
Chloride: 101 mmol/L (ref 98–111)
Creatinine, Ser: 0.65 mg/dL (ref 0.44–1.00)
Creatinine, Ser: 0.7 mg/dL (ref 0.44–1.00)
GFR, Estimated: >60 mL/min (ref >60)
GFR, Estimated: >60 mL/min (ref >60)
Glucose, Bld: 131 mg/dL — ABNORMAL HIGH (ref 70–99)
Glucose, Bld: 159 mg/dL — ABNORMAL HIGH (ref 70–99)
Potassium: 4.2 mmol/L (ref 3.5–5.1)
Potassium: 3.9 mmol/L (ref 3.5–5.1)
Sodium: 133 mmol/L — ABNORMAL LOW (ref 135–145)
Sodium: 133 mmol/L — ABNORMAL LOW (ref 135–145)

## 2023-05-16 LAB — GLUCOSE, CAPILLARY
Glucose-Capillary: 125 mg/dL — ABNORMAL HIGH (ref 70–99)
Glucose-Capillary: 127 mg/dL — ABNORMAL HIGH (ref 70–99)
Glucose-Capillary: 130 mg/dL — ABNORMAL HIGH (ref 70–99)
Glucose-Capillary: 136 mg/dL — ABNORMAL HIGH (ref 70–99)
Glucose-Capillary: 140 mg/dL — ABNORMAL HIGH (ref 70–99)
Glucose-Capillary: 145 mg/dL — ABNORMAL HIGH (ref 70–99)
Glucose-Capillary: 145 mg/dL — ABNORMAL HIGH (ref 70–99)
Glucose-Capillary: 70 mg/dL (ref 70–99)

## 2023-05-16 LAB — CBC
HCT: 24.9 % — ABNORMAL LOW (ref 36.0–46.0)
HCT: 25.7 % — ABNORMAL LOW (ref 36.0–46.0)
Hemoglobin: 8.4 g/dL — ABNORMAL LOW (ref 12.0–15.0)
Hemoglobin: 8.7 g/dL — ABNORMAL LOW (ref 12.0–15.0)
MCH: 29.3 pg (ref 26.0–34.0)
MCH: 29.3 pg (ref 26.0–34.0)
MCHC: 33.7 g/dL (ref 30.0–36.0)
MCHC: 33.9 g/dL (ref 30.0–36.0)
MCV: 86.8 fL (ref 80.0–100.0)
MCV: 86.5 fL (ref 80.0–100.0)
Platelets: 162 10*3/uL (ref 150–400)
Platelets: 198 10*3/uL (ref 150–400)
RBC: 2.87 MIL/uL — ABNORMAL LOW (ref 3.87–5.11)
RBC: 2.97 MIL/uL — ABNORMAL LOW (ref 3.87–5.11)
RDW: 13.1 % (ref 11.5–15.5)
RDW: 13.2 % (ref 11.5–15.5)
WBC: 14.8 10*3/uL — ABNORMAL HIGH (ref 4.0–10.5)
WBC: 15.4 10*3/uL — ABNORMAL HIGH (ref 4.0–10.5)
nRBC: 0 % (ref 0.0–0.2)
nRBC: 0 % (ref 0.0–0.2)

## 2023-05-16 LAB — MAGNESIUM
Magnesium: 2.3 mg/dL (ref 1.7–2.4)
Magnesium: 2 mg/dL (ref 1.7–2.4)

## 2023-05-16 LAB — SURGICAL PATHOLOGY

## 2023-05-16 MED ORDER — ASPIRIN 81 MG PO CHEW
81.0000 mg | CHEWABLE_TABLET | Freq: Every day | ORAL | Status: DC
  Filled 2023-05-16: qty 1

## 2023-05-16 MED ORDER — SODIUM CHLORIDE 0.9 % IV SOLN
12.5000 mg | Freq: Four times a day (QID) | INTRAVENOUS | Status: DC | PRN
  Administered 2023-05-16: 12.5 mg via INTRAVENOUS
  Filled 2023-05-16: qty 12.5

## 2023-05-16 MED ORDER — WARFARIN - PHYSICIAN DOSING INPATIENT: Freq: Every day | Status: DC

## 2023-05-16 MED ORDER — INSULIN ASPART 100 UNIT/ML IJ SOLN
0.0000 [IU] | INTRAMUSCULAR | Status: DC
  Administered 2023-05-16: 2 [IU] via SUBCUTANEOUS
  Administered 2023-05-17: 4 [IU] via SUBCUTANEOUS

## 2023-05-16 MED ORDER — ASPIRIN 81 MG PO TBEC
81.0000 mg | DELAYED_RELEASE_TABLET | Freq: Every day | ORAL | Status: DC
  Administered 2023-05-16 – 2023-05-17 (×2): 81 mg via ORAL
  Filled 2023-05-16 (×2): qty 1

## 2023-05-16 MED ORDER — FUROSEMIDE 10 MG/ML IJ SOLN
40.0000 mg | Freq: Four times a day (QID) | INTRAMUSCULAR | Status: AC
  Administered 2023-05-16 (×2): 40 mg via INTRAVENOUS
  Filled 2023-05-16 (×2): qty 4

## 2023-05-16 MED ORDER — FE FUM-VIT C-VIT B12-FA 460-60-0.01-1 MG PO CAPS
1.0000 | ORAL_CAPSULE | Freq: Every day | ORAL | Status: DC
  Administered 2023-05-18 – 2023-05-23 (×6): 1 via ORAL
  Filled 2023-05-16 (×8): qty 1

## 2023-05-16 MED ORDER — WARFARIN SODIUM 2.5 MG PO TABS
2.5000 mg | ORAL_TABLET | Freq: Once | ORAL | Status: AC
  Administered 2023-05-16: 2.5 mg via ORAL
  Filled 2023-05-16: qty 1

## 2023-05-16 MED ORDER — ENOXAPARIN SODIUM 40 MG/0.4ML IJ SOSY
40.0000 mg | PREFILLED_SYRINGE | Freq: Every day | INTRAMUSCULAR | Status: DC
  Administered 2023-05-16: 40 mg via SUBCUTANEOUS
  Filled 2023-05-16: qty 0.4

## 2023-05-16 MED FILL — Thrombin (Recombinant) For Soln 20000 Unit: CUTANEOUS | Qty: 1 | Status: AC

## 2023-05-16 NOTE — Progress Notes (Signed)
EVENING ROUNDS NOTE :     [address]       [place] [address]             [phone]                 1 Day Post-Op Procedure(s) (LRB): AORTIC VALVE REPLACEMENT (AVR) USING SJM REGENT AORTIC VALVE SIZE (N/A) TRANSESOPHAGEAL ECHOCARDIOGRAM (N/A)   Total Length of Stay:  LOS: 1 day  Events:   No events Stable day    BP 138/72   Pulse 85   Temp 98 F (36.7 C) (Oral)   Resp (!) 21   Ht 5\' 5"  (1.651 m)   Wt 102.4 kg   LMP 04/15/2023 (Exact Date)   SpO2 97%   BMI 37.57 kg/m   PAP: (27-40)/(15-24) 35/23 CO:  [4.2 L/min-5.6 L/min] 4.4 L/min CI:  [2 L/min/m2-2.7 L/min/m2] 2.1 L/min/m2      sodium chloride Stopped (05/16/23 0755)   sodium chloride      ceFAZolin (ANCEF) IV Stopped (05/16/23 1427)   lactated ringers Stopped (05/16/23 0700)   lactated ringers Stopped (05/16/23 0802)   promethazine (PHENERGAN) injection (IM or IVPB)      I/O last 3 completed shifts: In: 5551.8 [I.V.:2706.4; Blood:221; IV Piggyback:2624.4] Out: 4569 [Urine:3475; Emesis/NG output:200; Blood:414; Chest Tube:480]      Latest Ref Rng & Units 05/16/2023    3:27 AM 05/15/2023    6:12 PM 05/15/2023    5:14 PM  CBC  WBC 4.0 - 10.5 K/uL 14.8  18.5    Hemoglobin 12.0 - 15.0 g/dL 8.4  8.8    8.5  8.2   Hematocrit 36.0 - 46.0 % 24.9  26.0    25.0  24.0   Platelets 150 - 400 K/uL 162  156         Latest Ref Rng & Units 05/16/2023    3:27 AM 05/15/2023    6:12 PM 05/15/2023    5:14 PM  BMP  Glucose 70 - 99 mg/dL 213  086    BUN 6 - 20 mg/dL 9  10    Creatinine 5.78 - 1.00 mg/dL 4.69  6.29    Sodium 528 - 145 mmol/L 133  134    136  135   Potassium 3.5 - 5.1 mmol/L 4.2  4.8    5.0  5.1   Chloride 98 - 111 mmol/L 102  105    CO2 22 - 32 mmol/L 22  21    Calcium 8.9 - 10.3 mg/dL 7.6  7.9      ABG    Component Value Date/Time   PHART 7.294 (L) 05/15/2023 1812   PCO2ART 43.1 05/15/2023 1812   PO2ART 131 (H) 05/15/2023 1812   HCO3 20.9 05/15/2023 1812   TCO2  22 05/15/2023 1812   ACIDBASEDEF 5.0 (H) 05/15/2023 1812   O2SAT 99 05/15/2023 1812       Brynda Greathouse, MD 05/16/2023 5:30 PM

## 2023-05-16 NOTE — Progress Notes (Signed)
1 Day Post-Op Procedure(s) (LRB): AORTIC VALVE REPLACEMENT (AVR) USING SJM REGENT AORTIC VALVE SIZE (N/A) TRANSESOPHAGEAL ECHOCARDIOGRAM (N/A) Subjective: Some nausea but otherwise feels ok  Objective: Vital signs in last 24 hours: Temp:  [97 F (36.1 C)-99.3 F (37.4 C)] 99 F (37.2 C) (07/23 0600) Pulse Rate:  [79-82] 80 (07/23 0600) Cardiac Rhythm: Atrial paced (07/22 2000) Resp:  [15-25] 17 (07/23 0600) BP: (75-114)/(41-70) 90/50 (07/22 1800) SpO2:  [95 %-100 %] 96 % (07/23 0600) Arterial Line BP: (96-156)/(22-71) 116/54 (07/23 0600) FiO2 (%):  [40 %-50 %] 40 % (07/22 1652) Weight:  [102.4 kg] 102.4 kg (07/23 0500)  Hemodynamic parameters for last 24 hours: PAP: (24-40)/(7-24) 32/22 CO:  [2.6 L/min-5.6 L/min] 4.7 L/min CI:  [1.3 L/min/m2-2.7 L/min/m2] 2.3 L/min/m2  Intake/Output from previous day: 07/22 0701 - 07/23 0700 In: 5551.8 [I.V.:2706.4; Blood:221; IV Piggyback:2624.4] Out: 4569 [Urine:3475; Emesis/NG output:200; Blood:414; Chest Tube:480] Intake/Output this shift: Total I/O In: 744.9 [I.V.:502.3; IV Piggyback:242.6] Out: 795 [Urine:365; Emesis/NG output:200; Chest Tube:230]  General appearance: alert and cooperative Neurologic: intact Heart: regular rate and rhythm, mechanical valve click, no murmur Lungs: clear to auscultation bilaterally Abdomen: soft, non-tender Extremities: mild edema Wound: dressing dry  Lab Results: Recent Labs    05/15/23 1812 05/16/23 0327  WBC 18.5* 14.8*  HGB 8.8*  8.5* 8.4*  HCT 26.0*  25.0* 24.9*  PLT 156 162   BMET:  Recent Labs    05/15/23 1812 05/16/23 0327  NA 134*  136 133*  K 4.8  5.0 4.2  CL 105 102  CO2 21* 22  GLUCOSE 146* 131*  BUN 10 9  CREATININE 0.76 0.65  CALCIUM 7.9* 7.6*    PT/INR:  Recent Labs    05/15/23 1322  LABPROT 19.1*  INR 1.6*   ABG    Component Value Date/Time   PHART 7.294 (L) 05/15/2023 1812   HCO3 20.9 05/15/2023 1812   TCO2 22 05/15/2023 1812   ACIDBASEDEF  5.0 (H) 05/15/2023 1812   O2SAT 99 05/15/2023 1812   CBG (last 3)  Recent Labs    05/16/23 0209 05/16/23 0326 05/16/23 0437  GLUCAP 140* 145* 125*   CXR: ok  ECG pending.  Assessment/Plan: S/P Procedure(s) (LRB): AORTIC VALVE REPLACEMENT (AVR) USING SJM REGENT AORTIC VALVE SIZE (N/A) TRANSESOPHAGEAL ECHOCARDIOGRAM (N/A)  POD 1  Hemodynamically stable in sinus rhythm 70's. Keep pacing wires today. Hold lopressor until stable off neo.  DC swan, arterial line.  Glucose under adequate control with no hx of DM and normal Hgb A1c preop. DC insulin drip and start SSI today. Probably stop by tomorrow.  Dangle this am and DC chest tubes.  Start Coumadin 2.5 mg tonight. ASA to 81 mg until Coumadin therapeutic.  Volume excess: Wt is 7 lbs up. Will start diuresis later today once off neo.  Expected postop blood loss anemia: start iron and follow.  IS, OOB, mobilize.   LOS: 1 day    Brianna Turner 05/16/2023

## 2023-05-17 ENCOUNTER — Inpatient Hospital Stay (HOSPITAL_COMMUNITY): Payer: Commercial Managed Care - PPO

## 2023-05-17 LAB — PROTIME-INR
INR: 1.3 — ABNORMAL HIGH (ref 0.8–1.2)
Prothrombin Time: 16.4 seconds — ABNORMAL HIGH (ref 11.4–15.2)

## 2023-05-17 LAB — CBC
HCT: 26.2 % — ABNORMAL LOW (ref 36.0–46.0)
Hemoglobin: 8.9 g/dL — ABNORMAL LOW (ref 12.0–15.0)
MCH: 29.7 pg (ref 26.0–34.0)
MCHC: 34 g/dL (ref 30.0–36.0)
MCV: 87.3 fL (ref 80.0–100.0)
Platelets: 230 10*3/uL (ref 150–400)
RBC: 3 MIL/uL — ABNORMAL LOW (ref 3.87–5.11)
RDW: 13.2 % (ref 11.5–15.5)
WBC: 14.9 10*3/uL — ABNORMAL HIGH (ref 4.0–10.5)
nRBC: 0 % (ref 0.0–0.2)

## 2023-05-17 LAB — BASIC METABOLIC PANEL
Anion gap: 12 (ref 5–15)
BUN: 9 mg/dL (ref 6–20)
CO2: 26 mmol/L (ref 22–32)
Calcium: 8 mg/dL — ABNORMAL LOW (ref 8.9–10.3)
Chloride: 95 mmol/L — ABNORMAL LOW (ref 98–111)
Creatinine, Ser: 0.78 mg/dL (ref 0.44–1.00)
GFR, Estimated: >60 mL/min (ref >60)
Glucose, Bld: 160 mg/dL — ABNORMAL HIGH (ref 70–99)
Potassium: 3.4 mmol/L — ABNORMAL LOW (ref 3.5–5.1)
Sodium: 133 mmol/L — ABNORMAL LOW (ref 135–145)

## 2023-05-17 LAB — GLUCOSE, CAPILLARY
Glucose-Capillary: 165 mg/dL — ABNORMAL HIGH (ref 70–99)
Glucose-Capillary: 128 mg/dL — ABNORMAL HIGH (ref 70–99)
Glucose-Capillary: 147 mg/dL — ABNORMAL HIGH (ref 70–99)
Glucose-Capillary: 100 mg/dL — ABNORMAL HIGH (ref 70–99)
Glucose-Capillary: 143 mg/dL — ABNORMAL HIGH (ref 70–99)

## 2023-05-17 MED ORDER — BISACODYL 10 MG RE SUPP: 10.0000 mg | Freq: Every day | RECTAL | Status: DC | PRN

## 2023-05-17 MED ORDER — DOCUSATE SODIUM 100 MG PO CAPS
200.0000 mg | ORAL_CAPSULE | Freq: Every day | ORAL | Status: DC
  Administered 2023-05-19 – 2023-05-21 (×3): 200 mg via ORAL
  Filled 2023-05-17 (×5): qty 2

## 2023-05-17 MED ORDER — TRAMADOL HCL 50 MG PO TABS
50.0000 mg | ORAL_TABLET | ORAL | Status: DC | PRN
  Administered 2023-05-17 – 2023-05-18 (×3): 100 mg via ORAL
  Filled 2023-05-17 (×4): qty 2

## 2023-05-17 MED ORDER — ASPIRIN 81 MG PO TBEC
81.0000 mg | DELAYED_RELEASE_TABLET | Freq: Every day | ORAL | Status: DC
  Administered 2023-05-18 – 2023-05-22 (×5): 81 mg via ORAL
  Filled 2023-05-17 (×5): qty 1

## 2023-05-17 MED ORDER — SODIUM CHLORIDE 0.9 % IV SOLN: 250.0000 mL | INTRAVENOUS | Status: DC | PRN

## 2023-05-17 MED ORDER — WARFARIN SODIUM 2.5 MG PO TABS
2.5000 mg | ORAL_TABLET | Freq: Once | ORAL | Status: AC
  Administered 2023-05-17: 2.5 mg via ORAL
  Filled 2023-05-17: qty 1

## 2023-05-17 MED ORDER — SODIUM CHLORIDE 0.9% FLUSH: 3.0000 mL | INTRAVENOUS | Status: DC | PRN

## 2023-05-17 MED ORDER — POTASSIUM CHLORIDE 20 MEQ PO PACK
20.0000 meq | PACK | ORAL | Status: AC
  Administered 2023-05-17 (×3): 20 meq
  Filled 2023-05-17 (×3): qty 1

## 2023-05-17 MED ORDER — METOPROLOL SUCCINATE ER 25 MG PO TB24
25.0000 mg | ORAL_TABLET | Freq: Every day | ORAL | Status: DC
  Administered 2023-05-18 – 2023-05-19 (×3): 25 mg via ORAL
  Filled 2023-05-17 (×3): qty 1

## 2023-05-17 MED ORDER — ~~LOC~~ CARDIAC SURGERY, PATIENT & FAMILY EDUCATION: Freq: Once | Status: AC

## 2023-05-17 MED ORDER — OXYCODONE HCL 5 MG PO TABS: 5.0000 mg | ORAL_TABLET | ORAL | Status: DC | PRN

## 2023-05-17 MED ORDER — SODIUM CHLORIDE 0.9% FLUSH
3.0000 mL | Freq: Two times a day (BID) | INTRAVENOUS | Status: DC
  Administered 2023-05-17 – 2023-05-22 (×10): 3 mL via INTRAVENOUS

## 2023-05-17 MED ORDER — BISACODYL 5 MG PO TBEC
10.0000 mg | DELAYED_RELEASE_TABLET | Freq: Every day | ORAL | Status: DC | PRN
  Administered 2023-05-19: 10 mg via ORAL
  Filled 2023-05-17: qty 2

## 2023-05-17 MED ORDER — METOPROLOL SUCCINATE ER 25 MG PO TB24: 25.0000 mg | ORAL_TABLET | Freq: Every day | ORAL | Status: DC

## 2023-05-17 NOTE — Plan of Care (Signed)

## 2023-05-17 NOTE — Plan of Care (Signed)
  Problem: Health Behavior/Discharge Planning: Goal: Ability to manage health-related needs will improve Outcome: Progressing   Problem: Education: Goal: Knowledge of General Education information will improve Description: Including pain rating scale, medication(s)/side effects and non-pharmacologic comfort measures Outcome: Completed/Met   Problem: Clinical Measurements: Goal: Ability to maintain clinical measurements within normal limits will improve Outcome: Completed/Met Goal: Will remain free from infection Outcome: Completed/Met Goal: Diagnostic test results will improve Outcome: Completed/Met Goal: Respiratory complications will improve Outcome: Completed/Met Goal: Cardiovascular complication will be avoided Outcome: Completed/Met   Problem: Activity: Goal: Risk for activity intolerance will decrease Outcome: Completed/Met   Problem: Nutrition: Goal: Adequate nutrition will be maintained Outcome: Completed/Met   Problem: Coping: Goal: Level of anxiety will decrease Outcome: Completed/Met   Problem: Elimination: Goal: Will not experience complications related to bowel motility Outcome: Completed/Met Goal: Will not experience complications related to urinary retention Outcome: Completed/Met   Problem: Pain Managment: Goal: General experience of comfort will improve Outcome: Completed/Met   Problem: Safety: Goal: Ability to remain free from injury will improve Outcome: Completed/Met   Problem: Skin Integrity: Goal: Risk for impaired skin integrity will decrease Outcome: Completed/Met   Problem: Education: Goal: Will demonstrate proper wound care and an understanding of methods to prevent future damage Outcome: Completed/Met Goal: Knowledge of disease or condition will improve Outcome: Completed/Met Goal: Knowledge of the prescribed therapeutic regimen will improve Outcome: Completed/Met Goal: Individualized Educational Video(s) Outcome: Completed/Met    Problem: Activity: Goal: Risk for activity intolerance will decrease Outcome: Completed/Met   Problem: Cardiac: Goal: Will achieve and/or maintain hemodynamic stability Outcome: Completed/Met   Problem: Clinical Measurements: Goal: Postoperative complications will be avoided or minimized Outcome: Completed/Met   Problem: Respiratory: Goal: Respiratory status will improve Outcome: Completed/Met   Problem: Skin Integrity: Goal: Wound healing without signs and symptoms of infection Outcome: Completed/Met Goal: Risk for impaired skin integrity will decrease Outcome: Completed/Met   Problem: Urinary Elimination: Goal: Ability to achieve and maintain adequate renal perfusion and functioning will improve Outcome: Completed/Met

## 2023-05-17 NOTE — Progress Notes (Signed)
2 Days Post-Op Procedure(s) (LRB): AORTIC VALVE REPLACEMENT (AVR) USING SJM REGENT AORTIC VALVE SIZE (N/A) TRANSESOPHAGEAL ECHOCARDIOGRAM (N/A) Subjective: No complaints. Nausea resolved. Ambulated this am and ate some breakfast.  Objective: Vital signs in last 24 hours: Temp:  [98 F (36.7 C)-100.3 F (37.9 C)] 98.5 F (36.9 C) (07/24 0355) Pulse Rate:  [73-97] 89 (07/24 0500) Cardiac Rhythm: Normal sinus rhythm (07/24 0000) Resp:  [14-24] 20 (07/24 0500) BP: (101-149)/(62-95) 127/70 (07/24 0500) SpO2:  [90 %-97 %] 95 % (07/24 0500) Arterial Line BP: (121-157)/(57-70) 142/61 (07/23 1400) Weight:  [103 kg] 103 kg (07/24 0500)  Hemodynamic parameters for last 24 hours: PAP: (34-35)/(22-23) 35/23 CO:  [4.4 L/min] 4.4 L/min CI:  [2.1 L/min/m2] 2.1 L/min/m2  Intake/Output from previous day: 07/23 0701 - 07/24 0700 In: 535.1 [I.V.:82.3; IV Piggyback:452.8] Out: 4590 [Urine:4530; Chest Tube:60] Intake/Output this shift: No intake/output data recorded.  General appearance: alert and cooperative Neurologic: intact Heart: regular rate and rhythm, mechanical valve click, no murmur Lungs: clear to auscultation bilaterally Extremities: extremities normal, atraumatic, no cyanosis or edema Wound: dressing dry  Lab Results: Recent Labs    05/16/23 1727 05/17/23 0533  WBC 15.4* 14.9*  HGB 8.7* 8.9*  HCT 25.7* 26.2*  PLT 198 230   BMET:  Recent Labs    05/16/23 1727 05/17/23 0533  NA 133* 133*  K 3.9 3.4*  CL 101 95*  CO2 24 26  GLUCOSE 159* 160*  BUN 8 9  CREATININE 0.70 0.78  CALCIUM 8.0* 8.0*    PT/INR:  Recent Labs    05/17/23 0533  LABPROT 16.4*  INR 1.3*   ABG    Component Value Date/Time   PHART 7.294 (L) 05/15/2023 1812   HCO3 20.9 05/15/2023 1812   TCO2 22 05/15/2023 1812   ACIDBASEDEF 5.0 (H) 05/15/2023 1812   O2SAT 99 05/15/2023 1812   CBG (last 3)  Recent Labs    05/16/23 1723 05/16/23 2300 05/17/23 0354  GLUCAP 145* 130* 165*    CXR: ok  Assessment/Plan: S/P Procedure(s) (LRB): AORTIC VALVE REPLACEMENT (AVR) USING SJM REGENT AORTIC VALVE SIZE (N/A) TRANSESOPHAGEAL ECHOCARDIOGRAM (N/A)  POD 2 Hemodynamically stable in sinus rhythm 105. Will resume Toprol 25 since BP ok.  Diuresed well yesterday. -4L. Wt up a pound but not accurate. She is probably around baseline.  DC pacing wires and is BP stable will DC sleeve 2 hrs later.  Glucose under adequate control and normal Hgb A1c preop. Will DC CBG's and SSI.  Coumadin 2.5 mg again today and if no bump will increase to 5 mg tomorrow.  Transfer to 4E and continue mobilization.   LOS: 2 days    Alleen Borne 05/17/2023

## 2023-05-17 NOTE — Progress Notes (Signed)
Patient ID: Brianna Turner, female   DOB: October 26, 1975, 47 y.o.   MRN: [id number] TCTS Evening Rounds:  Hemodynamically stable in sinus rhythm.  Ambulating well.  Waiting on 4E bed.

## 2023-05-18 LAB — BASIC METABOLIC PANEL
Anion gap: 8 (ref 5–15)
CO2: 27 mmol/L (ref 22–32)
Calcium: 8 mg/dL — ABNORMAL LOW (ref 8.9–10.3)
Chloride: 99 mmol/L (ref 98–111)
Creatinine, Ser: 0.68 mg/dL (ref 0.44–1.00)
GFR, Estimated: >60 mL/min (ref >60)
Glucose, Bld: 109 mg/dL — ABNORMAL HIGH (ref 70–99)
Potassium: 3.7 mmol/L (ref 3.5–5.1)
Sodium: 134 mmol/L — ABNORMAL LOW (ref 135–145)

## 2023-05-18 LAB — CBC
HCT: 23.8 % — ABNORMAL LOW (ref 36.0–46.0)
Hemoglobin: 7.9 g/dL — ABNORMAL LOW (ref 12.0–15.0)
MCH: 28.2 pg (ref 26.0–34.0)
MCHC: 33.2 g/dL (ref 30.0–36.0)
RBC: 2.8 MIL/uL — ABNORMAL LOW (ref 3.87–5.11)
RDW: 13.1 % (ref 11.5–15.5)
WBC: 13.1 10*3/uL — ABNORMAL HIGH (ref 4.0–10.5)

## 2023-05-18 LAB — PROTIME-INR: Prothrombin Time: 18.1 seconds — ABNORMAL HIGH (ref 11.4–15.2)

## 2023-05-18 MED ORDER — POTASSIUM CHLORIDE 20 MEQ PO PACK
20.0000 meq | PACK | Freq: Once | ORAL | Status: AC
  Administered 2023-05-18: 20 meq
  Filled 2023-05-18: qty 1

## 2023-05-18 MED ORDER — WARFARIN SODIUM 2.5 MG PO TABS
2.5000 mg | ORAL_TABLET | Freq: Once | ORAL | Status: AC
  Administered 2023-05-18: 2.5 mg via ORAL
  Filled 2023-05-18: qty 1

## 2023-05-18 MED ORDER — POTASSIUM CHLORIDE 20 MEQ PO PACK
20.0000 meq | PACK | ORAL | Status: AC
  Administered 2023-05-18 (×2): 20 meq
  Filled 2023-05-18 (×3): qty 1

## 2023-05-18 NOTE — TOC Initial Note (Signed)
Transition of Care [facility]) - Initial/Assessment Note    Patient Details  Name: Brianna Turner MRN: [id number] Date of Birth: [birth year]  [facility] CM/SW Contact:    Elliot Cousin, RN Phone Number: [phone] 05/18/2023, 9:05 AM  Clinical Narrative:  CM spoke to pt and reports she will be staying with her mother for a week after dc. Discussed with pt Home health. HH preoperatively arranged with [facility]. Will continue to follow for dc needs.                     Barriers to Discharge: Continued Medical Work up   Patient Goals and CMS Choice Patient states their goals for this hospitalization and ongoing recovery are:: wants to recover          Expected Discharge Plan and Services   Discharge Planning Services: CM Consult Post Acute Care Choice: Home Health Living arrangements for the past 2 months: Single Family Home                             HH Agency: [facility]        Prior Living Arrangements/Services Living arrangements for the past 2 months: Single Family Home Lives with:: Spouse Patient language and need for interpreter reviewed:: Yes Do you feel safe going back to the place where you live?: Yes      Need for Family Participation in Patient Care: No (Comment) Care giver support system in place?: Yes (comment)   Criminal Activity/Legal Involvement Pertinent to Current Situation/Hospitalization: No - Comment as needed  Activities of Daily Living      Permission Sought/Granted Permission sought to share information with : Case Manager, Family Supports, PCP Permission granted to share information with : Yes, Verbal Permission Granted              Emotional Assessment Appearance:: Appears stated age Attitude/Demeanor/Rapport: Engaged Affect (typically observed): Accepting Orientation: : Oriented to Self, Oriented to Place, Oriented to  Time, Oriented to Situation   Psych Involvement: No (comment)  Admission  diagnosis:  S/P AVR (aortic valve replacement) [Z95.2] Patient Active Problem List   Diagnosis Date Noted   S/P AVR (aortic valve replacement) 05/15/2023   Obstructive hypertrophic cardiomyopathy (HCC) 01/12/2023   Murmur 11/18/2022   Syncope and collapse 11/10/2022   PCP:  Pcp, No Pharmacy:   [id number] - [place], [place] - [address] [place] [address] Phone: [phone] Fax: [phone]     Social Determinants of Health (SDOH) Social History: SDOH Screenings   Food Insecurity: No Food Insecurity (05/18/2023)  Housing: Low Risk  (05/18/2023)  Transportation Needs: No Transportation Needs (05/18/2023)  Utilities: Not At Risk (05/18/2023)  Tobacco Use: Medium Risk (05/11/2023)   SDOH Interventions: Food Insecurity Interventions: Intervention Not Indicated Housing Interventions: Intervention Not Indicated Transportation Interventions: Intervention Not Indicated Utilities Interventions: Intervention Not Indicated   Readmission Risk Interventions     No data to display

## 2023-05-18 NOTE — Progress Notes (Signed)
3 Days Post-Op Procedure(s) (LRB): AORTIC VALVE REPLACEMENT (AVR) USING SJM REGENT AORTIC VALVE SIZE (N/A) TRANSESOPHAGEAL ECHOCARDIOGRAM (N/A) Subjective: No complaints. Ambulating well  Objective: Vital signs in last 24 hours: Temp:  [98 F (36.7 C)-99.8 F (37.7 C)] 98.5 F (36.9 C) (07/25 0600) Pulse Rate:  [76-113] 77 (07/25 0600) Cardiac Rhythm: Normal sinus rhythm (07/25 0400) Resp:  [15-25] 16 (07/25 0600) BP: (100-157)/(58-78) 112/63 (07/25 0600) SpO2:  [89 %-100 %] 100 % (07/25 0600)  Hemodynamic parameters for last 24 hours:    Intake/Output from previous day: 07/24 0701 - 07/25 0700 In: 443.4 [P.O.:390; IV Piggyback:53.4] Out: 1235 [Urine:1235] Intake/Output this shift: No intake/output data recorded.  General appearance: alert and cooperative Neurologic: intact Heart: regular rate and rhythm, mechanical valve click,  no murmur Lungs: clear to auscultation bilaterally Extremities: no edema Wound: dressing dry  Lab Results: Recent Labs    05/17/23 0533 05/18/23 0059  WBC 14.9* 13.1*  HGB 8.9* 7.9*  HCT 26.2* 23.8*  PLT 230 233   BMET:  Recent Labs    05/17/23 0533 05/18/23 0059  NA 133* 134*  K 3.4* 3.7  CL 95* 99  CO2 26 27  GLUCOSE 160* 109*  BUN 9 8  CREATININE 0.78 0.68  CALCIUM 8.0* 8.0*    PT/INR:  Recent Labs    05/18/23 0059  LABPROT 18.1*  INR 1.5*   ABG    Component Value Date/Time   PHART 7.294 (L) 05/15/2023 1812   HCO3 20.9 05/15/2023 1812   TCO2 22 05/15/2023 1812   ACIDBASEDEF 5.0 (H) 05/15/2023 1812   O2SAT 99 05/15/2023 1812   CBG (last 3)  Recent Labs    05/17/23 1146 05/17/23 1635 05/17/23 2005  GLUCAP 147* 100* 143*    Assessment/Plan: S/P Procedure(s) (LRB): AORTIC VALVE REPLACEMENT (AVR) USING SJM REGENT AORTIC VALVE SIZE (N/A) TRANSESOPHAGEAL ECHOCARDIOGRAM (N/A)  POD 3  Hemodynamically stable in sinus rhythm. Continue Toprol.  INR bumped slightly 1.3 to 1.5. Will give 2.5 mg  Coumadin again tonight.  Awaiting bed on 4E. Continue IS, ambulation.   LOS: 3 days    Alleen Borne 05/18/2023

## 2023-05-18 NOTE — Progress Notes (Addendum)
Pt reports she's walking without AD, offered to assist with ambulation around unit however pt declined stating that her pain medicine is "kicking in" and she will walk later. I explained my role and she received OHS book. Will f/u later.    Pt reports walking around unit w/o assist.  Brianna Turner 05/18/2023 10:58 AM

## 2023-05-19 MED ORDER — WARFARIN SODIUM 4 MG PO TABS
4.0000 mg | ORAL_TABLET | Freq: Once | ORAL | Status: AC
  Administered 2023-05-19: 4 mg via ORAL
  Filled 2023-05-19: qty 1

## 2023-05-19 NOTE — Progress Notes (Addendum)
[address]       [place] [address]             [phone]     4 Days Post-Op Procedure(s) (LRB): AORTIC VALVE REPLACEMENT (AVR) USING SJM REGENT AORTIC VALVE SIZE (N/A) TRANSESOPHAGEAL ECHOCARDIOGRAM (N/A) Subjective: Feels well, had a bowel movement  Objective: Vital signs in last 24 hours: Temp:  [98 F (36.7 C)-98.5 F (36.9 C)] 98 F (36.7 C) (07/26 0045) Pulse Rate:  [87-89] 87 (07/26 0045) Cardiac Rhythm: Normal sinus rhythm (07/25 2008) Resp:  [15-20] 19 (07/26 0045) BP: (106-127)/(65-73) 107/72 (07/26 0045) SpO2:  [94 %-99 %] 95 % (07/26 0045)  Hemodynamic parameters for last 24 hours:    Intake/Output from previous day: 07/25 0701 - 07/26 0700 In: 340 [P.O.:340] Out: -  Intake/Output this shift: No intake/output data recorded.  General appearance: alert, cooperative, and no distress Heart: regular rate and rhythm and no murmur Lungs: dim in bases Abdomen: benign Extremities: minimal edema Wound: incis healing well  Lab Results: Recent Labs    05/17/23 0533 05/18/23 0059  WBC 14.9* 13.1*  HGB 8.9* 7.9*  HCT 26.2* 23.8*  PLT 230 233   BMET:  Recent Labs    05/17/23 0533 05/18/23 0059  NA 133* 134*  K 3.4* 3.7  CL 95* 99  CO2 26 27  GLUCOSE 160* 109*  BUN 9 8  CREATININE 0.78 0.68  CALCIUM 8.0* 8.0*    PT/INR:  Recent Labs    05/19/23 0115  LABPROT 18.5*  INR 1.5*   ABG    Component Value Date/Time   PHART 7.294 (L) 05/15/2023 1812   HCO3 20.9 05/15/2023 1812   TCO2 22 05/15/2023 1812   ACIDBASEDEF 5.0 (H) 05/15/2023 1812   O2SAT 99 05/15/2023 1812   CBG (last 3)  Recent Labs    05/17/23 1146 05/17/23 1635 05/17/23 2005  GLUCAP 147* 100* 143*    Meds Scheduled Meds:  acetaminophen  1,000 mg Oral Q6H   Or   acetaminophen (TYLENOL) oral liquid 160 mg/5 mL  1,000 mg Per Tube Q6H   aspirin EC  81 mg Oral Daily   docusate sodium  200 mg Oral Daily   Fe Fum-Vit C-Vit B12-FA  1 capsule  Oral QPC breakfast   metoprolol succinate  25 mg Oral QHS   pantoprazole  40 mg Oral Daily   sodium chloride flush  3 mL Intravenous Q12H   Warfarin - Physician Dosing Inpatient   Does not apply q1600   Continuous Infusions:  sodium chloride     promethazine (PHENERGAN) injection (IM or IVPB) Stopped (05/16/23 1823)   PRN Meds:.sodium chloride, bisacodyl **OR** bisacodyl, ondansetron (ZOFRAN) IV, oxyCODONE, promethazine (PHENERGAN) injection (IM or IVPB), sodium chloride flush, traMADol  Xrays No results found.  Assessment/Plan: S/P Procedure(s) (LRB): AORTIC VALVE REPLACEMENT (AVR) USING SJM REGENT AORTIC VALVE SIZE (N/A) TRANSESOPHAGEAL ECHOCARDIOGRAM (N/A) POD#4  1 afeb, VSS, sinus rhythm 2 O2 sats good on RA 3 voiding- not all measured, weight not done yet 4 INR 1.5, unchanged from yesterday, will increase to 4 mg coumadin today 5 doing well, cont rehab and pulm hygiene 6 likely home 1-2 days when INR closer to 2     LOS: 4 days    Rowe Clack PA-C Pager [phone] 05/19/2023   Chart reviewed, patient examined, agree with above. She is doing very well. Coumadin 4 mg today and home when close to INR 2. Goal is  2-3. 

## 2023-05-19 NOTE — Progress Notes (Signed)
Pt able to transition from lying flat to standing w/o assistance while following sternal precautions. Pt reports mobilizing in hallway and room. Pt plans to stay with mom after surgery and denies any DME needs for home.  Pt received OHS book and education on restrictions, heart healthy diet, ex guidelines, Move in the Tube sheet, incentive spirometer use when d/c and CRPII. Pt denies questions and was encouraged to look in the book for additional information. Referral placed to [facility]. Faustino Congress MS ACSM-CEP 05/19/2023 10:13 AM

## 2023-05-19 NOTE — Plan of Care (Signed)
  Problem: Health Behavior/Discharge Planning: Goal: Ability to manage health-related needs will improve Outcome: Progressing   Problem: Education: Goal: Knowledge of General Education information will improve Description: Including pain rating scale, medication(s)/side effects and non-pharmacologic comfort measures Outcome: Progressing   Problem: Health Behavior/Discharge Planning: Goal: Ability to manage health-related needs will improve Outcome: Progressing   Problem: Clinical Measurements: Goal: Ability to maintain clinical measurements within normal limits will improve Outcome: Progressing Goal: Will remain free from infection Outcome: Progressing Goal: Diagnostic test results will improve Outcome: Progressing Goal: Respiratory complications will improve Outcome: Progressing Goal: Cardiovascular complication will be avoided Outcome: Progressing   Problem: Activity: Goal: Risk for activity intolerance will decrease Outcome: Progressing   Problem: Nutrition: Goal: Adequate nutrition will be maintained Outcome: Progressing   Problem: Coping: Goal: Level of anxiety will decrease Outcome: Progressing   Problem: Elimination: Goal: Will not experience complications related to bowel motility Outcome: Progressing Goal: Will not experience complications related to urinary retention Outcome: Progressing   Problem: Pain Managment: Goal: General experience of comfort will improve Outcome: Progressing   Problem: Safety: Goal: Ability to remain free from injury will improve Outcome: Progressing   Problem: Skin Integrity: Goal: Risk for impaired skin integrity will decrease Outcome: Progressing

## 2023-05-20 MED ORDER — WARFARIN SODIUM 5 MG PO TABS
5.0000 mg | ORAL_TABLET | Freq: Once | ORAL | Status: AC
  Administered 2023-05-20: 5 mg via ORAL
  Filled 2023-05-20: qty 1

## 2023-05-20 MED ORDER — METOPROLOL SUCCINATE ER 50 MG PO TB24
50.0000 mg | ORAL_TABLET | Freq: Every day | ORAL | Status: DC
  Administered 2023-05-20 – 2023-05-22 (×3): 50 mg via ORAL
  Filled 2023-05-20 (×3): qty 1

## 2023-05-20 MED ORDER — MAGNESIUM HYDROXIDE 400 MG/5ML PO SUSP
30.0000 mL | Freq: Every day | ORAL | Status: DC | PRN
  Filled 2023-05-20: qty 30

## 2023-05-20 NOTE — Progress Notes (Signed)
CARDIAC REHAB PHASE I   PRE:  Rate/Rhythm: 105 ST  BP:  Supine: 153/80  MODE:  Ambulation: 940 ft   POST:  Rate/Rhythm: 125 ST  BP:  Sitting: 154/85  Pt found OOB ambulating independently in room. Pt amb in hallway with no assistance, able to maintain steady gait throughout, tolerated well with no s/sx. Returned to bed with call bell in reach, answered questions regarding ED done yesterday. Pt voiced understanding.  [phone]  [name], MS, ACSM-CEP 05/20/2023 9:17 AM

## 2023-05-20 NOTE — Progress Notes (Addendum)
      [address]       [place] [address]             [phone]     5 Days Post-Op Procedure(s) (LRB): AORTIC VALVE REPLACEMENT (AVR) USING SJM REGENT AORTIC VALVE SIZE (N/A) TRANSESOPHAGEAL ECHOCARDIOGRAM (N/A) Subjective: Conts to feel well  Objective: Vital signs in last 24 hours: Temp:  [98.1 F (36.7 C)-98.9 F (37.2 C)] 98.5 F (36.9 C) (07/27 0321) Pulse Rate:  [80-90] 80 (07/27 0321) Cardiac Rhythm: Normal sinus rhythm (07/26 2038) Resp:  [14-20] 14 (07/27 0321) BP: (115-132)/(67-80) 119/70 (07/27 0321) SpO2:  [94 %-98 %] 97 % (07/27 0321) Weight:  [102.6 kg] 102.6 kg (07/27 0327)  Hemodynamic parameters for last 24 hours:    Intake/Output from previous day: 07/26 0701 - 07/27 0700 In: 480 [P.O.:480] Out: -  Intake/Output this shift: No intake/output data recorded.  General appearance: alert, cooperative, and no distress Heart: regular rate and rhythm Lungs: clear to auscultation bilaterally Abdomen: benign Extremities: no edema Wound: incis healing well  Lab Results: Recent Labs    05/18/23 0059  WBC 13.1*  HGB 7.9*  HCT 23.8*  PLT 233   BMET:  Recent Labs    05/18/23 0059  NA 134*  K 3.7  CL 99  CO2 27  GLUCOSE 109*  BUN 8  CREATININE 0.68  CALCIUM 8.0*    PT/INR:  Recent Labs    05/20/23 0028  LABPROT 18.0*  INR 1.5*   ABG    Component Value Date/Time   PHART 7.294 (L) 05/15/2023 1812   HCO3 20.9 05/15/2023 1812   TCO2 22 05/15/2023 1812   ACIDBASEDEF 5.0 (H) 05/15/2023 1812   O2SAT 99 05/15/2023 1812   CBG (last 3)  Recent Labs    05/17/23 1146 05/17/23 1635 05/17/23 2005  GLUCAP 147* 100* 143*    Meds Scheduled Meds:  acetaminophen  1,000 mg Oral Q6H   Or   acetaminophen (TYLENOL) oral liquid 160 mg/5 mL  1,000 mg Per Tube Q6H   aspirin EC  81 mg Oral Daily   docusate sodium  200 mg Oral Daily   Fe Fum-Vit C-Vit B12-FA  1 capsule Oral QPC breakfast   metoprolol succinate  25 mg  Oral QHS   pantoprazole  40 mg Oral Daily   sodium chloride flush  3 mL Intravenous Q12H   Warfarin - Physician Dosing Inpatient   Does not apply q1600   Continuous Infusions:  sodium chloride     PRN Meds:.sodium chloride, bisacodyl **OR** bisacodyl, ondansetron (ZOFRAN) IV, oxyCODONE, sodium chloride flush, traMADol  Xrays No results found.  Assessment/Plan: S/P Procedure(s) (LRB): AORTIC VALVE REPLACEMENT (AVR) USING SJM REGENT AORTIC VALVE SIZE (N/A) TRANSESOPHAGEAL ECHOCARDIOGRAM (N/A) POD#5  1 afeb, VSS, SR/sinus tachy to 120's at times- will increase metoprolol dose to 50 2 O2 sats are good on RA 3 weight about 3 Kg>preop if accurate, voiding well, not all measured 4 INR 1.5, will increase coumadin to  5 mg 5 cont rehab/pulm hygiene 6 likely home tomorrow if INR andheart rhythm/rate ok  LOS: 5 days    Rowe Clack PA-C Pager [phone] 05/20/2023 Patient examined, medical records reviewed. Patient looks great and ready to go home after the INR approaches 1.8-2.0 Discharge instructions reviewed with patient Surgical incision clean and dry, mechanical AVR sounds fine.  Lovett Sox MD

## 2023-05-21 LAB — PROTIME-INR
INR: 1.5 — ABNORMAL HIGH (ref 0.8–1.2)
Prothrombin Time: 18.3 s — ABNORMAL HIGH (ref 11.4–15.2)

## 2023-05-21 MED ORDER — WARFARIN SODIUM 5 MG PO TABS
7.5000 mg | ORAL_TABLET | Freq: Once | ORAL | Status: AC
  Administered 2023-05-21: 7.5 mg via ORAL
  Filled 2023-05-21: qty 1

## 2023-05-21 MED ORDER — LOSARTAN POTASSIUM 25 MG PO TABS
25.0000 mg | ORAL_TABLET | Freq: Every day | ORAL | Status: DC
  Administered 2023-05-21 – 2023-05-23 (×3): 25 mg via ORAL
  Filled 2023-05-21 (×3): qty 1

## 2023-05-21 NOTE — Progress Notes (Addendum)
      [address]       [place] [address]             [phone]     6 Days Post-Op Procedure(s) (LRB): AORTIC VALVE REPLACEMENT (AVR) USING SJM REGENT AORTIC VALVE SIZE (N/A) TRANSESOPHAGEAL ECHOCARDIOGRAM (N/A) Subjective: Feels ok, a little frustrated   Objective: Vital signs in last 24 hours: Temp:  [98.4 F (36.9 C)-99.1 F (37.3 C)] 99.1 F (37.3 C) (07/28 0314) Pulse Rate:  [85-100] 85 (07/28 0314) Cardiac Rhythm: Normal sinus rhythm (07/27 1900) Resp:  [13-20] 18 (07/28 0314) BP: (133-168)/(72-95) 136/74 (07/28 0314) SpO2:  [93 %-100 %] 99 % (07/28 0314) Weight:  [99.9 kg] 99.9 kg (07/28 0314)  Hemodynamic parameters for last 24 hours:    Intake/Output from previous day: 07/27 0701 - 07/28 0700 In: 240 [P.O.:240] Out: -  Intake/Output this shift: No intake/output data recorded.  General appearance: alert, cooperative, and no distress Heart: regular rate and rhythm Lungs: clear to auscultation bilaterally Abdomen: bemign Extremities: no edema Wound: incis healing well  Lab Results: No results for input(s): "WBC", "HGB", "HCT", "PLT" in the last 72 hours. BMET: No results for input(s): "NA", "K", "CL", "CO2", "GLUCOSE", "BUN", "CREATININE", "CALCIUM" in the last 72 hours.  PT/INR:  Recent Labs    05/21/23 0448  LABPROT 18.3*  INR 1.5*   ABG    Component Value Date/Time   PHART 7.294 (L) 05/15/2023 1812   HCO3 20.9 05/15/2023 1812   TCO2 22 05/15/2023 1812   ACIDBASEDEF 5.0 (H) 05/15/2023 1812   O2SAT 99 05/15/2023 1812   CBG (last 3)  No results for input(s): "GLUCAP" in the last 72 hours.  Meds Scheduled Meds:  aspirin EC  81 mg Oral Daily   docusate sodium  200 mg Oral Daily   Fe Fum-Vit C-Vit B12-FA  1 capsule Oral QPC breakfast   metoprolol succinate  50 mg Oral QHS   pantoprazole  40 mg Oral Daily   sodium chloride flush  3 mL Intravenous Q12H   Warfarin - Physician Dosing Inpatient   Does not apply q1600    Continuous Infusions:  sodium chloride     PRN Meds:.sodium chloride, bisacodyl **OR** bisacodyl, magnesium hydroxide, ondansetron (ZOFRAN) IV, oxyCODONE, sodium chloride flush, traMADol  Xrays No results found.  Assessment/Plan: S/P Procedure(s) (LRB): AORTIC VALVE REPLACEMENT (AVR) USING SJM REGENT AORTIC VALVE SIZE (N/A) TRANSESOPHAGEAL ECHOCARDIOGRAM (N/A) POD#6  1 Tmax 99.1, S BP 133-168, sinus rhythm/tachy, beta blocker increased yesterday, will add ARB as BP fairly consistently elevated 2 O2 sats good on RA 2 voiding - not measured , weight below preop 4 + BM 5 INR 1.5- will give 7.5 dose today 6 holding d/c till INR closer to 2 and BP/HR better controlled 7 recheck CBC in am 8 routine pulm hygiene and rehab     LOS: 6 days    Rowe Clack PA-C Pager [phone] 05/21/2023   patient examined and medical record reviewed,agree with above note. Lovett Sox 05/21/2023

## 2023-05-22 ENCOUNTER — Ambulatory Visit: Payer: Commercial Managed Care - PPO | Admitting: Cardiology

## 2023-05-22 MED ORDER — WARFARIN SODIUM 5 MG PO TABS
10.0000 mg | ORAL_TABLET | Freq: Once | ORAL | Status: AC
  Administered 2023-05-22: 10 mg via ORAL
  Filled 2023-05-22: qty 2

## 2023-05-22 NOTE — Progress Notes (Signed)
7 Days Post-Op Procedure(s) (LRB): AORTIC VALVE REPLACEMENT (AVR) USING SJM REGENT AORTIC VALVE SIZE (N/A) TRANSESOPHAGEAL ECHOCARDIOGRAM (N/A) Subjective:  Feels well. Frustrated with INR not rising yet but understands the need to be here until close to therapeutic.  Objective: Vital signs in last 24 hours: Temp:  [98.2 F (36.8 C)-98.6 F (37 C)] 98.3 F (36.8 C) (07/29 0430) Pulse Rate:  [78-99] 78 (07/29 0430) Cardiac Rhythm: Normal sinus rhythm (07/28 1900) Resp:  [16-19] 16 (07/29 0430) BP: (112-142)/(52-90) 117/52 (07/29 0430) SpO2:  [96 %-98 %] 97 % (07/29 0430) Weight:  [98.6 kg] 98.6 kg (07/29 0430)  Hemodynamic parameters for last 24 hours:    Intake/Output from previous day: 07/28 0701 - 07/29 0700 In: 120 [P.O.:120] Out: -  Intake/Output this shift: No intake/output data recorded.  General appearance: alert and cooperative Neurologic: intact Heart: regular rate and rhythm, mechanical valve click, no murmur Lungs: clear to auscultation bilaterally Extremities: no edema Wound: incision healing well.  Lab Results: Recent Labs    05/22/23 0501  WBC 11.2*  HGB 9.2*  HCT 28.3*  PLT 540*   BMET: No results for input(s): "NA", "K", "CL", "CO2", "GLUCOSE", "BUN", "CREATININE", "CALCIUM" in the last 72 hours.  PT/INR:  Recent Labs    05/22/23 0501  LABPROT 18.7*  INR 1.5*   ABG    Component Value Date/Time   PHART 7.294 (L) 05/15/2023 1812   HCO3 20.9 05/15/2023 1812   TCO2 22 05/15/2023 1812   ACIDBASEDEF 5.0 (H) 05/15/2023 1812   O2SAT 99 05/15/2023 1812   CBG (last 3)  No results for input(s): "GLUCAP" in the last 72 hours.  Assessment/Plan: S/P Procedure(s) (LRB): AORTIC VALVE REPLACEMENT (AVR) USING SJM REGENT AORTIC VALVE SIZE (N/A) TRANSESOPHAGEAL ECHOCARDIOGRAM (N/A)  POD 7 She remains hemodynamically stable in sinus rhythm.  INR has remained 1.5 despite increasing dose of Coumadin. Will increase to 10 mg tonight.   I  told her that she can shower off telemetry.  Continue IS, ambulation.  Home once INR approaches 2.0 so we know what dose of Coumadin to send her home on.   LOS: 7 days    Alleen Borne 05/22/2023

## 2023-05-22 NOTE — Plan of Care (Signed)
  Problem: Health Behavior/Discharge Planning: Goal: Ability to manage health-related needs will improve Outcome: Progressing   Problem: Education: Goal: Knowledge of General Education information will improve Description: Including pain rating scale, medication(s)/side effects and non-pharmacologic comfort measures Outcome: Progressing   Problem: Health Behavior/Discharge Planning: Goal: Ability to manage health-related needs will improve Outcome: Progressing   Problem: Clinical Measurements: Goal: Ability to maintain clinical measurements within normal limits will improve Outcome: Progressing Goal: Will remain free from infection Outcome: Progressing Goal: Diagnostic test results will improve Outcome: Progressing Goal: Respiratory complications will improve Outcome: Progressing Goal: Cardiovascular complication will be avoided Outcome: Progressing   Problem: Activity: Goal: Risk for activity intolerance will decrease Outcome: Progressing   Problem: Nutrition: Goal: Adequate nutrition will be maintained Outcome: Progressing   Problem: Coping: Goal: Level of anxiety will decrease Outcome: Progressing   Problem: Elimination: Goal: Will not experience complications related to bowel motility Outcome: Progressing Goal: Will not experience complications related to urinary retention Outcome: Progressing   Problem: Pain Managment: Goal: General experience of comfort will improve Outcome: Progressing   Problem: Safety: Goal: Ability to remain free from injury will improve Outcome: Progressing   Problem: Skin Integrity: Goal: Risk for impaired skin integrity will decrease Outcome: Progressing

## 2023-05-22 NOTE — Progress Notes (Signed)
Pt crying and upset that INR is not moving and she really wants to go home. Pt states doctor will let her have a shower today and she hopes this makes her feel better. She will call her mother and ask her to bring her a book. Pt resting with call bell within reach.  Will continue to monitor.

## 2023-05-23 MED ORDER — FE FUM-VIT C-VIT B12-FA 460-60-0.01-1 MG PO CAPS: 1.0000 | ORAL_CAPSULE | Freq: Every day | ORAL | 1 refills | Status: DC

## 2023-05-23 MED ORDER — OXYCODONE HCL 5 MG PO TABS: 5.0000 mg | ORAL_TABLET | Freq: Four times a day (QID) | ORAL | 0 refills | Status: DC | PRN

## 2023-05-23 MED ORDER — WARFARIN SODIUM 5 MG PO TABS: 10.0000 mg | ORAL_TABLET | Freq: Every day | ORAL | 2 refills | Status: DC

## 2023-05-23 MED ORDER — METOPROLOL SUCCINATE ER 50 MG PO TB24: 50.0000 mg | ORAL_TABLET | Freq: Every day | ORAL | 1 refills | Status: DC

## 2023-05-23 MED ORDER — LOSARTAN POTASSIUM 25 MG PO TABS: 25.0000 mg | ORAL_TABLET | Freq: Every day | ORAL | 1 refills | Status: DC

## 2023-05-23 NOTE — Progress Notes (Signed)
8 Days Post-Op Procedure(s) (LRB): AORTIC VALVE REPLACEMENT (AVR) USING SJM REGENT AORTIC VALVE SIZE (N/A) TRANSESOPHAGEAL ECHOCARDIOGRAM (N/A) Subjective: No complaints.  Objective: Vital signs in last 24 hours: Temp:  [98 F (36.7 C)-98.9 F (37.2 C)] 98 F (36.7 C) (07/30 0417) Pulse Rate:  [77-101] 77 (07/30 0417) Cardiac Rhythm: Sinus tachycardia (07/29 1900) Resp:  [14-19] 19 (07/30 0417) BP: (105-120)/(61-77) 107/63 (07/30 0417) SpO2:  [96 %-99 %] 97 % (07/30 0417) Weight:  [97.3 kg] 97.3 kg (07/30 0606)  Hemodynamic parameters for last 24 hours:    Intake/Output from previous day: 07/29 0701 - 07/30 0700 In: 120 [P.O.:120] Out: -  Intake/Output this shift: No intake/output data recorded.  General appearance: alert and cooperative Neurologic: intact Heart: regular rate and rhythm Lungs: clear to auscultation bilaterally Extremities: no edema Wound: incision healing well  Lab Results: Recent Labs    05/22/23 0501  WBC 11.2*  HGB 9.2*  HCT 28.3*  PLT 540*   BMET: No results for input(s): "NA", "K", "CL", "CO2", "GLUCOSE", "BUN", "CREATININE", "CALCIUM" in the last 72 hours.  PT/INR:  Recent Labs    05/23/23 0217  LABPROT 22.6*  INR 2.0*   ABG    Component Value Date/Time   PHART 7.294 (L) 05/15/2023 1812   HCO3 20.9 05/15/2023 1812   TCO2 22 05/15/2023 1812   ACIDBASEDEF 5.0 (H) 05/15/2023 1812   O2SAT 99 05/15/2023 1812   CBG (last 3)  No results for input(s): "GLUCAP" in the last 72 hours.  Assessment/Plan: S/P Procedure(s) (LRB): AORTIC VALVE REPLACEMENT (AVR) USING SJM REGENT AORTIC VALVE SIZE (N/A) TRANSESOPHAGEAL ECHOCARDIOGRAM (N/A)  POD 8  INR up to 2.0. Will send home on 10 mg daily with INR check on Thursday. Goal is INR 2.5-3.   LOS: 8 days    Alleen Borne 05/23/2023

## 2023-05-23 NOTE — TOC Transition Note (Signed)
Transition of Care (TOC) - CM/SW Discharge Note Donn Pierini RN, BSN Transitions of Care Unit 4E- RN Case Manager See Treatment Team for direct phone #   Patient Details  Name: Brianna Turner MRN: [id number] Date of Birth: August 06, 1976  [facility]) CM/SW Contact:  Darrold Span, RN Phone Number: 05/23/2023, 11:54 AM   Clinical Narrative:    Pt stable for transition home, plan to stay with mother post discharge initially. No TOC needs noted. Family to transport home.    Final next level of care: Home/Self Care Barriers to Discharge: Barriers Resolved   Patient Goals and CMS Choice   Choice offered to / list presented to : NA  Discharge Placement                 Home        Discharge Plan and Services Additional resources added to the After Visit Summary for   In-house Referral: NA Discharge Planning Services: CM Consult Post Acute Care Choice: Home Health          DME Arranged: N/A DME Agency: NA       HH Arranged: NA HH Agency: NA        Social Determinants of Health (SDOH) Interventions SDOH Screenings   Food Insecurity: No Food Insecurity (05/18/2023)  Housing: Low Risk  (05/18/2023)  Transportation Needs: No Transportation Needs (05/18/2023)  Utilities: Not At Risk (05/18/2023)  Tobacco Use: Medium Risk (05/11/2023)     Readmission Risk Interventions    05/23/2023   11:54 AM  Readmission Risk Prevention Plan  Post Dischage Appt Complete  Medication Screening Complete  Transportation Screening Complete

## 2023-05-23 NOTE — Progress Notes (Signed)
Pt ambulates independently in the hallway. Pt has had multiple walks throughout the day. Pt resting with call bell within reach.  Will continue to monitor.

## 2023-05-24 MED FILL — Sodium Chloride IV Soln 0.9%: INTRAVENOUS | Qty: 2000 | Status: AC

## 2023-05-24 MED FILL — Albumin, Human Inj 5%: INTRAVENOUS | Qty: 250 | Status: AC

## 2023-05-24 MED FILL — Lidocaine HCl Local Soln Prefilled Syringe 100 MG/5ML (2%): INTRAMUSCULAR | Qty: 5 | Status: AC

## 2023-05-24 MED FILL — Sodium Bicarbonate IV Soln 8.4%: INTRAVENOUS | Qty: 50 | Status: AC

## 2023-05-24 MED FILL — Electrolyte-R (PH 7.4) Solution: INTRAVENOUS | Qty: 6000 | Status: AC

## 2023-05-24 MED FILL — Heparin Sodium (Porcine) Inj 1000 Unit/ML: INTRAMUSCULAR | Qty: 10 | Status: AC

## 2023-05-25 ENCOUNTER — Ambulatory Visit: Payer: Commercial Managed Care - PPO | Admitting: Cardiology

## 2023-05-25 DIAGNOSIS — Z952 Presence of prosthetic heart valve: Secondary | ICD-10-CM

## 2023-05-25 LAB — POCT INR: INR: 3.3 — AB (ref 2.0–3.0)

## 2023-05-29 DIAGNOSIS — Z4802 Encounter for removal of sutures: Secondary | ICD-10-CM

## 2023-05-31 ENCOUNTER — Ambulatory Visit: Payer: Commercial Managed Care - PPO | Admitting: Cardiology

## 2023-05-31 ENCOUNTER — Telehealth (HOSPITAL_COMMUNITY): Payer: Self-pay

## 2023-05-31 ENCOUNTER — Telehealth: Payer: Self-pay

## 2023-05-31 ENCOUNTER — Encounter: Payer: Self-pay | Admitting: Cardiology

## 2023-05-31 VITALS — BP 137/79 | HR 86 | Resp 16 | Ht 65.0 in | Wt 214.0 lb

## 2023-05-31 DIAGNOSIS — I350 Nonrheumatic aortic (valve) stenosis: Secondary | ICD-10-CM

## 2023-05-31 DIAGNOSIS — Z952 Presence of prosthetic heart valve: Secondary | ICD-10-CM

## 2023-05-31 LAB — POCT INR: INR: 3.7 — AB (ref 2.0–3.0)

## 2023-05-31 MED ORDER — WARFARIN SODIUM 2.5 MG PO TABS: 2.5000 mg | ORAL_TABLET | ORAL | 2 refills | Status: DC

## 2023-05-31 MED ORDER — AMOXICILLIN 500 MG PO TABS: 2000.0000 mg | ORAL_TABLET | ORAL | 1 refills | Status: AC

## 2023-05-31 MED ORDER — ASPIRIN 81 MG PO TBEC: 81.0000 mg | DELAYED_RELEASE_TABLET | Freq: Every day | ORAL | 3 refills | Status: AC

## 2023-05-31 NOTE — Progress Notes (Signed)
Description   05/25/2023    INR today was 3.3. Patient goal level is (2.0-3.0)  Prior dose was 10 mg daily  Patient will continue 10 mg daily.   Pt will recheck INR 1 weeks on 08/08.   Lab Results      Component                Value               Date                      INR                      3.3 (A)             05/25/2023                INR                      2.0 (H)             05/23/2023                INR                      1.5 (H)             05/22/2023             S/P AVR (aortic valve replacement)  (primary encounter diagnosis) Plan: POCT INR

## 2023-05-31 NOTE — Progress Notes (Signed)
Subjective:   Brianna Turner, female    DOB: 1975/10/25, 47 y.o.   MRN: [id number]   Chief Complaint  Patient presents with   Hospitalization Follow-up    HPI  47 y.o. 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  She is lost some weight.  She has not had recurrence of chest pain or shortness of breath.  She will start cardiac rehab after cleared by surgery.  Initial consultation visit 10/2022: Patient works as an International aid/development worker at [employer].  2 weeks ago, patient went to work, parked her car and ran to her store given that it was raining.  As she entered the building, she felt hot, lightheaded, sat down, followed by complete loss of consciousness for at least 1 minute.  She reportedly saw the CCTV footage and was told that her fingers and toes were turning blue.  Her coworkers called 911, who had asked to get a ED by patient's side.  Before that could happen, she regained consciousness.  She then went to [facility], ER.  She was reportedly mildly hypotensive on arrival there, but was better after IV fluids.  She was found to have murmur on exam.  She was thus referred for cardiology evaluation.  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  She does not recall having been like this in the recent past.  She denies any chest pain, leg edema, orthopnea.  She reports mild dyspnea with climbing up stairs.  Prior to her syncope episode, she did not drink any water regularly.  She does drink 3-4 cups of coffee, which she has now switched partly to decaf.  She rarely drinks alcohol.  She smokes half pack a day for last 30 years.  Patient has family history of cardiac events.  Mother's brother died at age 87, presumed to be heart attack, but no autopsy was performed to patient's knowledge.  Mother sister also had "heart attack" at age 71, but details are not known.  Separately,  this brother has had 5 stents, and mother's mother has had pacemaker.  Patient has no siblings, has 2 children aged 26 and 75.   Current Outpatient Medications:    Fe Fum-Vit C-Vit B12-FA (TRIGELS-F FORTE) CAPS capsule, Take 1 capsule by mouth daily after breakfast., Disp: 30 capsule, Rfl: 1   losartan (COZAAR) 25 MG tablet, Take 1 tablet (25 mg total) by mouth daily., Disp: 30 tablet, Rfl: 1   metoprolol succinate (TOPROL-XL) 50 MG 24 hr tablet, Take 1 tablet (50 mg total) by mouth at bedtime. Take with or immediately following a meal., Disp: 30 tablet, Rfl: 1   oxyCODONE (OXY IR/ROXICODONE) 5 MG immediate release tablet, Take 1 tablet (5 mg total) by mouth every 6 (six) hours as needed for severe pain., Disp: 30 tablet, Rfl: 0   warfarin (COUMADIN) 5 MG tablet, Take 2 tablets (10 mg total) by mouth daily. As Interior and spatial designer by your cardiology office coumadin clinic, Disp: 100 tablet, Rfl: 2   Cardiovascular and other pertinent studies:  Reviewed external labs and tests, independently interpreted  Op note 05/15/2023 (Dr. Laneta Simmers): Aortic valve replacement using a 21 mm St. Jude Regent mechanical valve.   Intra op TEE 05/15/2023: Limited post AVR exam: The patient separated easily from CPB.  _ Left Ventricle: The left ventricular function is unchanged from  pre-bypass  images. The LV has has normal concentric contractility.  Overall EF 60%.  _ Right Ventricle: The right ventricular function appears normal,  unchanged from  pre-bypass images.  _ Aortic Valve: A mechanical valve has been placed in the aortic position,  leaflets are freely mobile. No regurgitation post repair. Normal washing  jets  are seen. The peak gradient is 13 mmHg, mean gradient 6 mmHg.  _ Mitral Valve: The mitral valve function appears unchanged from  pre-bypass  images. There is trivial regurgitation.  _ Tricuspid Valve: The tricuspid valve appears unchanged from pre-bypass  images.  There is mild regurgitation.    Cardiac MRI 03/03/2023: 1. Unicommissural aortic valve. Abnormal flow dephasing at the level of the aortic valve, with no significant flow dephasing in the LVOT and no systolic anterior motion of the mitral valve. Upon reviewing echocardiogram Doppler, findings concerning for critical aortic valve stenosis.   2. Normal biventricular chamber size and function. LVEF 65%, RVEF 70%.   3. Moderate LV basal-mid septal hypertrophy, 13-14 mm, with hypertrophied papillary muscles. No significant delayed myocardial enhancement. ECV 25%. Findings overall do not suggest hypertrophic cardiomyopathy.    Recent labs: 05/18/2023: Glucose 109, BUN/Cr 8/0.68. EGFR >60. Na/K 134/3.7.  H/H 9/28. MCV 86. Platelets 540 HbA1C 5.3%    Review of Systems  Cardiovascular:  Positive for syncope. Negative for chest pain, dyspnea on exertion, leg swelling and palpitations.         Vitals:   05/31/23 1408  BP: 137/79  Pulse: 86  Resp: 16  SpO2: 96%      Body mass index is 35.61 kg/m. Filed Weights   05/31/23 1408  Weight: 214 lb (97.1 kg)      Objective:   Physical Exam Vitals and nursing note reviewed.  Constitutional:      General: She is not in acute distress. Neck:     Vascular: No JVD.  Cardiovascular:     Rate and Rhythm: Normal rate and regular rhythm.     Heart sounds: Murmur heard.     Harsh holosystolic murmur is present with a grade of 4/6 at the upper right sternal border radiating to the neck. Murmur increased with Valsalva  Pulmonary:     Effort: Pulmonary effort is normal.     Breath sounds: Normal breath sounds. No wheezing or rales.  Musculoskeletal:     Right lower leg: No edema.     Left lower leg: No edema.          Visit diagnoses:   ICD-10-CM   1. Severe aortic stenosis  I35.0     2. S/P AVR (aortic valve replacement)  Z95.2 EKG 12-Lead    POCT INR        Orders Placed This Encounter  Procedures   POCT INR   EKG 12-Lead         Assessment & Recommendations:     47 y.o. 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 Recommend adding aspirin 81 mg daily, amoxicillin for endocarditis prophylaxis before dental procedure.  Adjusted warfarin dose as below.  Description   05/31/2023    INR today was 3.7. Patient goal level is (2.0-3.0)  Prior dose was 10 mg daily.  Patient will now start on 7.5 mg Monday, Wednesday, and 10 mg all the other days.  Pt will recheck INR 2 weeks on 08/21.   Lab Results      Component                Value               Date                      INR                      3.7 (A)             05/31/2023                INR                      3.3 (A)             05/25/2023                INR                      2.0 (H)             05/23/2023             S/P AVR (aortic valve replacement)  (primary encounter diagnosis) Plan: POCT INR    F/u in 6 months    Elder Negus, MD Pager: [phone] Office: [phone]

## 2023-05-31 NOTE — Telephone Encounter (Signed)
Per phase I cardiac rehab, fax referral to [facility].

## 2023-05-31 NOTE — Telephone Encounter (Signed)
FMLA/STD forms completed. Beginning LOA 05/15/23 through 08/14/23./ DOS 05/15/23. Patient will pick up forms from [employer] front desk./

## 2023-06-07 ENCOUNTER — Encounter: Payer: Self-pay | Admitting: Cardiology

## 2023-06-07 ENCOUNTER — Other Ambulatory Visit: Payer: Self-pay | Admitting: Cardiology

## 2023-06-07 NOTE — Telephone Encounter (Signed)
From patient.

## 2023-06-13 ENCOUNTER — Other Ambulatory Visit: Payer: Self-pay | Admitting: Cardiology

## 2023-06-13 DIAGNOSIS — R002 Palpitations: Secondary | ICD-10-CM

## 2023-06-13 NOTE — Telephone Encounter (Signed)
We could use a 2 week monitor to assess if there is rhythm abnormality happening. Ordered.  Thanks MJP

## 2023-06-14 ENCOUNTER — Other Ambulatory Visit: Payer: Self-pay | Admitting: Surgical

## 2023-06-14 ENCOUNTER — Ambulatory Visit: Payer: Commercial Managed Care - PPO | Admitting: Cardiology

## 2023-06-14 ENCOUNTER — Other Ambulatory Visit: Payer: Commercial Managed Care - PPO

## 2023-06-14 DIAGNOSIS — R002 Palpitations: Secondary | ICD-10-CM

## 2023-06-14 DIAGNOSIS — Z952 Presence of prosthetic heart valve: Secondary | ICD-10-CM

## 2023-06-14 LAB — POCT INR: INR: 3.2 — AB (ref 2.0–3.0)

## 2023-06-14 NOTE — Telephone Encounter (Signed)
Done

## 2023-06-14 NOTE — Progress Notes (Signed)
Description   06/14/2023  INR today was 3.2. Patient goal level is (2.0-3.0)  Prior dose was 7.5 mg Monday, Wednesday, and 10 mg all the other days.   Patient will start 7.5mg  Mon, Tues, & Fri, 10mg  all other days.  Pt will recheck INR 3 weeks on 06/04/2023   Lab Results      Component                Value               Date                      INR                      3.2 (A)             06/14/2023                INR                      3.7 (A)             05/31/2023                INR                      3.3 (A)             05/25/2023            S/P AVR (aortic valve replacement)  (primary encounter diagnosis) Plan: POCT INR

## 2023-06-15 ENCOUNTER — Other Ambulatory Visit: Payer: Self-pay | Admitting: Surgical

## 2023-06-20 ENCOUNTER — Other Ambulatory Visit: Payer: Self-pay | Admitting: Surgery

## 2023-06-20 DIAGNOSIS — I350 Nonrheumatic aortic (valve) stenosis: Secondary | ICD-10-CM

## 2023-06-21 ENCOUNTER — Ambulatory Visit (INDEPENDENT_AMBULATORY_CARE_PROVIDER_SITE_OTHER): Payer: Self-pay | Admitting: Surgery

## 2023-06-21 ENCOUNTER — Encounter: Payer: Self-pay | Admitting: Surgery

## 2023-06-21 ENCOUNTER — Ambulatory Visit: Admission: RE | Admit: 2023-06-21 | Payer: Commercial Managed Care - PPO | Source: Ambulatory Visit

## 2023-06-21 VITALS — BP 108/73 | HR 76 | Resp 20 | Ht 65.0 in | Wt 213.0 lb

## 2023-06-21 DIAGNOSIS — I350 Nonrheumatic aortic (valve) stenosis: Secondary | ICD-10-CM

## 2023-06-21 DIAGNOSIS — Z952 Presence of prosthetic heart valve: Secondary | ICD-10-CM

## 2023-06-21 NOTE — Progress Notes (Signed)
HPI: Patient returns for routine postoperative follow-up having undergone aortic valve replacement using a 21 mm St. Jude Regent mechanical valve on 05/15/2023. The patient's early postoperative recovery while in the hospital was notable for an uncomplicated postoperative course. Since hospital discharge the patient reports that she has been feeling well.  She is walking daily without chest pain or shortness of breath.  She is starting outpatient cardiac rehab tomorrow.  Her INR has been followed by Dr. Rosemary Holms and was 3.2 on 06/14/2023.  Current Outpatient Medications  Medication Sig Dispense Refill   amoxicillin (AMOXIL) 500 MG tablet Take 4 tablets (2,000 mg total) by mouth as directed. 4 pills 30-60 min before dental procedure 10 tablet 1   aspirin EC 81 MG tablet Take 1 tablet (81 mg total) by mouth daily. Swallow whole. 90 tablet 3   Fe Fum-Vit C-Vit B12-FA (TRIGELS-F FORTE) CAPS capsule Take 1 capsule by mouth daily after breakfast. 30 capsule 1   losartan (COZAAR) 25 MG tablet Take 1 tablet (25 mg total) by mouth daily. 30 tablet 1   metoprolol succinate (TOPROL-XL) 50 MG 24 hr tablet Take 1 tablet (50 mg total) by mouth at bedtime. Take with or immediately following a meal. 30 tablet 1   oxyCODONE (OXY IR/ROXICODONE) 5 MG immediate release tablet Take 1 tablet (5 mg total) by mouth every 6 (six) hours as needed for severe pain. 30 tablet 0   warfarin (COUMADIN) 2.5 MG tablet TAKE 1 TABLET (2.5 MG TOTAL) BY MOUTH AS DIRECTED. 90 tablet 2   warfarin (COUMADIN) 5 MG tablet Take 2 tablets (10 mg total) by mouth daily. 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    Physical Exam: BP 108/73   Pulse 76   Resp 20   Ht 5\' 5"  (1.651 m)   Wt 213 lb (96.6 kg)   SpO2 96% Comment: RA  BMI 35.45 kg/m  She looks well. Cardiac exam shows a regular rate and rhythm with a crisp mechanical valve click.  There  is no murmur. Lungs are clear. Chest incision is healing well and the sternum is stable. There is no peripheral edema.  Diagnostic Tests:  Narrative & Impression  CLINICAL DATA:  History of mitral valve replacement on July 22nd. Heart monitor in place for 2 weeks.   EXAM: CHEST - 2 VIEW   COMPARISON:  Chest radiograph 05/17/2023   FINDINGS: Median sternotomy wires and mitral valve prosthesis are again noted. The right IJ vascular sheath has been removed. There is a new cardiac monitor projecting over the left chest.   The cardiomediastinal silhouette is normal   Aeration of the lung bases has improved. There is no new or worsening focal airspace disease. There is no pleural effusion or pneumothorax   There is no acute osseous abnormality.   IMPRESSION: Improved aeration of the lung bases since 05/17/2023 with no new or worsening airspace disease.     Electronically Signed   By: Lesia Hausen M.D.   On: 06/21/2023 12:21      Impression:  She is doing well approximately 5 weeks following her surgery.  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  Her INR will continue to be managed by Dr. Rosemary Holms.  Plan:  I will see her back in 6 weeks for follow-up before she returns to work.  Alleen Borne, MD [facility] [phone]

## 2023-06-29 NOTE — Progress Notes (Signed)
This encounter was created in error - please disregard.

## 2023-07-05 ENCOUNTER — Ambulatory Visit: Payer: Commercial Managed Care - PPO | Admitting: Cardiology

## 2023-07-05 DIAGNOSIS — Z952 Presence of prosthetic heart valve: Secondary | ICD-10-CM

## 2023-07-05 LAB — POCT INR: INR: 2.2 (ref 2.0–3.0)

## 2023-07-05 NOTE — Progress Notes (Signed)
Description   06/14/2023  INR today was 3.2. Patient goal level is (2.0-3.0)  Prior dose was 7.5 mg Monday, Wednesday, and 10 mg all the other days.   Patient will start 7.5mg  Mon, Tues, & Fri, 10mg  all other days.  Pt will recheck INR 3 weeks on 06/04/2023   Lab Results      Component                Value               Date                      INR                      3.2 (A)             06/14/2023                INR                      3.7 (A)             05/31/2023                INR                      3.3 (A)             05/25/2023            S/P AVR (aortic valve replacement)  (primary encounter diagnosis) Plan: POCT INR

## 2023-07-11 ENCOUNTER — Telehealth: Payer: Self-pay | Admitting: Pharmacist

## 2023-07-11 NOTE — Telephone Encounter (Signed)
Pt returned call, appt moved

## 2023-07-11 NOTE — Telephone Encounter (Signed)
Called pt to move upcoming INR check from [place] to [facility], left message.

## 2023-07-14 ENCOUNTER — Other Ambulatory Visit: Payer: Self-pay | Admitting: Surgical

## 2023-07-14 ENCOUNTER — Other Ambulatory Visit: Payer: Self-pay

## 2023-07-14 MED ORDER — LOSARTAN POTASSIUM 25 MG PO TABS: 25.0000 mg | ORAL_TABLET | Freq: Every day | ORAL | 3 refills | Status: DC

## 2023-08-02 ENCOUNTER — Encounter: Payer: Self-pay | Admitting: Surgery

## 2023-08-02 ENCOUNTER — Ambulatory Visit (INDEPENDENT_AMBULATORY_CARE_PROVIDER_SITE_OTHER): Payer: Self-pay | Admitting: Surgery

## 2023-08-02 VITALS — BP 112/79 | HR 70 | Resp 18 | Ht 65.0 in | Wt 217.0 lb

## 2023-08-02 DIAGNOSIS — Z952 Presence of prosthetic heart valve: Secondary | ICD-10-CM

## 2023-08-02 NOTE — Progress Notes (Signed)
   HPI:  Patient returns for routine postoperative follow-up having undergone aortic valve replacement using a 21 mm St. Jude Regent mechanical valve on 05/15/2023. The patient's early postoperative recovery while in the hospital was notable for an uncomplicated postoperative course.  Since I last saw her in the office on 06/21/2023 she has continued to do well.  She is anxious to return to work.  She has been on Coumadin for her mechanical valve that is now being followed in the anticoagulation clinic.  She is taking 7.5 mg 3 days/week and 10 mg/day 4 days a week.  She asked me about discontinuing the losartan that she is on.  Her blood pressure at home has been running on the lower side.  Losartan was started postoperatively in the hospital due to hypertension.   Current Outpatient Medications  Medication Sig Dispense Refill   amoxicillin (AMOXIL) 500 MG tablet Take 4 tablets (2,000 mg total) by mouth as directed. 4 pills 30-60 min before dental procedure 10 tablet 1   aspirin EC 81 MG tablet Take 1 tablet (81 mg total) by mouth daily. Swallow whole. 90 tablet 3   Fe Fum-Vit C-Vit B12-FA (TRIGELS-F FORTE) CAPS capsule Take 1 capsule by mouth daily after breakfast. 30 capsule 1   losartan (COZAAR) 25 MG tablet Take 1 tablet (25 mg total) by mouth daily. 90 tablet 3   metoprolol succinate (TOPROL-XL) 50 MG 24 hr tablet Take 1 tablet (50 mg total) by mouth at bedtime. Take with or immediately following a meal. 30 tablet 1   warfarin (COUMADIN) 5 MG tablet Take 2 tablets (10 mg total) by mouth daily. 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 90 tablet 2   No current facility-administered medications for this visit.    Physical Exam: BP 112/79   Pulse 70   Resp 18   Ht 5\' 5"  (1.651 m)   Wt 217 lb (98.4 kg)   SpO2 97% Comment: RA  BMI 36.11 kg/m  She looks well. Cardiac exam shows a  regular rate and rhythm with a crisp mechanical valve click.  There is no murmur. Lungs are clear. The chest incision is well-healed and the sternum is stable. There is no peripheral edema.  Diagnostic Tests:  None today  Impression:  She is almost 3 months out from her surgery and feeling well.  I told her that she could return to work without restriction on Monday, 08/07/2023.  I gave her a note to that effect.  I told her I thought that she could discontinue the losartan and continue to follow her blood pressures at home.  She will continue her other medications as prescribed.  Plan:  She will continue to follow-up with cardiology and her INR will be followed in the anticoagulation clinic.   Alleen Borne, MD [facility] [phone]

## 2023-08-04 ENCOUNTER — Other Ambulatory Visit: Payer: Self-pay

## 2023-08-04 ENCOUNTER — Ambulatory Visit: Payer: Commercial Managed Care - PPO | Attending: Cardiology

## 2023-08-04 DIAGNOSIS — Z5181 Encounter for therapeutic drug level monitoring: Secondary | ICD-10-CM

## 2023-08-04 DIAGNOSIS — Z952 Presence of prosthetic heart valve: Secondary | ICD-10-CM

## 2023-08-04 LAB — POCT INR: INR: 2.3 (ref 2.0–3.0)

## 2023-08-04 NOTE — Patient Instructions (Signed)
Continue taking 10 mg Daily, Except 7.5 mg on Monday, Tuesday and Friday.  INR in 4 weeks.   A full discussion of the nature of anticoagulants has been carried out.  A benefit risk analysis has been presented to the patient, so that they understand the justification for choosing anticoagulation at this time. The need for frequent and regular monitoring, precise dosage adjustment and compliance is stressed.  Side effects of potential bleeding are discussed.  The patient should avoid any OTC items containing aspirin or ibuprofen, and should avoid great swings in general diet.  Avoid alcohol consumption.  Call if any signs of abnormal bleeding.  [phone]

## 2023-09-01 ENCOUNTER — Ambulatory Visit: Payer: Commercial Managed Care - PPO | Attending: Cardiovascular Disease | Admitting: *Deleted

## 2023-09-01 DIAGNOSIS — Z952 Presence of prosthetic heart valve: Secondary | ICD-10-CM | POA: Diagnosis not present

## 2023-09-01 DIAGNOSIS — Z5181 Encounter for therapeutic drug level monitoring: Secondary | ICD-10-CM | POA: Insufficient documentation

## 2023-09-01 LAB — POCT INR: INR: 3.3 — AB (ref 2.0–3.0)

## 2023-09-01 NOTE — Patient Instructions (Signed)
Description   Do not take any warfarin today then continue taking 10mg  daily, except 7.5mg  on Monday, Tuesday and Friday. Recheck INR in 4 weeks. Call if any signs of abnormal bleeding. [phone]

## 2023-09-11 ENCOUNTER — Other Ambulatory Visit: Payer: Self-pay | Admitting: Cardiology

## 2023-09-29 ENCOUNTER — Ambulatory Visit: Payer: Commercial Managed Care - PPO | Attending: Cardiology

## 2023-09-29 DIAGNOSIS — Z5181 Encounter for therapeutic drug level monitoring: Secondary | ICD-10-CM

## 2023-09-29 DIAGNOSIS — Z952 Presence of prosthetic heart valve: Secondary | ICD-10-CM

## 2023-09-29 LAB — POCT INR: INR: 1.6 — AB (ref 2.0–3.0)

## 2023-09-29 NOTE — Patient Instructions (Signed)
TAKE 2 TABLETS TODAY then continue taking 10mg  daily, except 7.5mg  on Monday, Tuesday and Friday. Recheck INR in 2 weeks. Call if any signs of abnormal bleeding. [phone]

## 2023-10-13 ENCOUNTER — Ambulatory Visit: Payer: Commercial Managed Care - PPO | Attending: Cardiovascular Disease

## 2023-10-13 DIAGNOSIS — Z5181 Encounter for therapeutic drug level monitoring: Secondary | ICD-10-CM | POA: Diagnosis not present

## 2023-10-13 DIAGNOSIS — Z952 Presence of prosthetic heart valve: Secondary | ICD-10-CM

## 2023-10-13 LAB — POCT INR: INR: 2.2 (ref 2.0–3.0)

## 2023-10-13 NOTE — Patient Instructions (Signed)
continue taking 10mg  daily, except 7.5mg  on Monday, Tuesday and Friday. Recheck INR in 4 weeks. Call if any signs of abnormal bleeding. [phone]

## 2023-10-26 ENCOUNTER — Encounter: Payer: Self-pay | Admitting: Cardiology

## 2023-10-26 ENCOUNTER — Other Ambulatory Visit: Payer: Self-pay | Admitting: Surgical

## 2023-10-27 ENCOUNTER — Other Ambulatory Visit: Payer: Self-pay | Admitting: *Deleted

## 2023-10-27 DIAGNOSIS — Z952 Presence of prosthetic heart valve: Secondary | ICD-10-CM

## 2023-10-27 MED ORDER — WARFARIN SODIUM 5 MG PO TABS: ORAL_TABLET | ORAL | 1 refills | Status: DC

## 2023-10-27 NOTE — Telephone Encounter (Signed)
 Warfarin 5mg  refill S/P AVR with 21 mm St. Jude Regent mechanical valve  Last INR 10/13/23 Last OV 05/31/23

## 2023-11-10 ENCOUNTER — Ambulatory Visit: Payer: Commercial Managed Care - PPO | Attending: Cardiovascular Disease

## 2023-11-10 DIAGNOSIS — Z5181 Encounter for therapeutic drug level monitoring: Secondary | ICD-10-CM

## 2023-11-10 DIAGNOSIS — Z952 Presence of prosthetic heart valve: Secondary | ICD-10-CM

## 2023-11-10 LAB — POCT INR: INR: 3 (ref 2.0–3.0)

## 2023-11-10 NOTE — Patient Instructions (Signed)
continue taking 10mg  daily, except 7.5mg  on Monday, Tuesday and Friday. Recheck INR in 6 weeks. Call if any signs of abnormal bleeding. [phone]

## 2023-12-01 ENCOUNTER — Ambulatory Visit: Payer: Self-pay | Admitting: Cardiology

## 2023-12-04 ENCOUNTER — Encounter: Payer: Self-pay | Admitting: Cardiology

## 2023-12-04 ENCOUNTER — Ambulatory Visit: Payer: Commercial Managed Care - PPO | Attending: Cardiology | Admitting: Cardiology

## 2023-12-04 VITALS — BP 118/88 | HR 67 | Resp 16 | Ht 65.0 in | Wt 229.0 lb

## 2023-12-04 DIAGNOSIS — Z952 Presence of prosthetic heart valve: Secondary | ICD-10-CM

## 2023-12-04 NOTE — Patient Instructions (Signed)
 Testing/Procedures: Echo  Your physician has requested that you have an echocardiogram. Echocardiography is a painless test that uses sound waves to create images of your heart. It provides your doctor with information about the size and shape of your heart and how well your heart's chambers and valves are working. This procedure takes approximately one hour. There are no restrictions for this procedure. Please do NOT wear cologne, perfume, aftershave, or lotions (deodorant is allowed). Please arrive 15 minutes prior to your appointment time.  Please note: We ask at that you not bring children with you during ultrasound (echo/ vascular) testing. Due to room size and safety concerns, children are not allowed in the ultrasound rooms during exams. Our front office staff cannot provide observation of children in our lobby area while testing is being conducted. An adult accompanying a patient to their appointment will only be allowed in the ultrasound room at the discretion of the ultrasound technician under special circumstances. We apologize for any inconvenience.    Follow-Up: At [facility], you and your health needs are our priority.  As part of our continuing mission to provide you with exceptional heart care, we have created designated Provider Care Teams.  These Care Teams include your primary Cardiologist (physician) and Advanced Practice Providers (APPs -  Physician Assistants and Nurse Practitioners) who all work together to provide you with the care you need, when you need it.   Your next appointment:   1 year(s)  Provider:   Elder Negus, MD     Other Instructions   1st Floor: - Lobby - Registration  - Pharmacy  - Lab - Cafe  2nd Floor: - PV Lab - Diagnostic Testing (echo, CT, nuclear med)  3rd Floor: - Vacant  4th Floor: - TCTS (cardiothoracic surgery) - AFib Clinic - Structural Heart Clinic - Vascular Surgery  - Vascular Ultrasound  5th  Floor: - [facility] (general and EP) - Clinical Pharmacy for coumadin, hypertension, lipid, weight-loss medications, and med management appointments    Valet parking services will be available as well.

## 2023-12-04 NOTE — Progress Notes (Signed)
  Cardiology Office Note:  .   Date:  12/04/2023  ID:  [name], DOB 1975-11-28, MRN [id number] PCP: Pcp, No  [facility] Prov[facility] Cardiologist:  Fransico Ivy, MD PCP: Pcp, No  Chief Complaint  Patient presents with   Follow-up   Severe aortic stenosis      History of Present Illness: .    Brianna Turner is a 48 y.o. female with severe AS of unicommisural aortic valve, now s/p mechanical AVR for likely unicommisural aortic valve (04/2023)   Patient is doing well.  While she does not do regular exercise, she walks 23,000 steps daily during her recent trip to [place] did not have any chest pain, shortness of breath, lightheadedness symptoms.  She has occasional lightheadedness while at work, but lasts for only a second or so.  She has concerns about "gap" in her sternotomy surgical scar.  Patient's INR is managed at Coumadin  clinic  Vitals:   12/04/23 1029  BP: 118/88  Pulse: 67  Resp: 16  SpO2: 97%     ROS:  Review of Systems  Cardiovascular:  Negative for chest pain, dyspnea on exertion, leg swelling, palpitations and syncope.     Studies Reviewed: Aaron Aas        No new studies reviewed    Physical Exam:   Physical Exam Vitals and nursing note reviewed.  Constitutional:      General: She is not in acute distress. Neck:     Vascular: No JVD.  Cardiovascular:     Rate and Rhythm: Normal rate and regular rhythm.     Heart sounds: Normal heart sounds. No murmur heard.    Comments: Metallic S1 Pulmonary:     Effort: Pulmonary effort is normal.     Breath sounds: Normal breath sounds. No wheezing or rales.      VISIT DIAGNOSES:   ICD-10-CM   1. S/P AVR with 21 mm St. Jude Regent mechanical valve 05/15/2023  Z95.2 ECHOCARDIOGRAM COMPLETE       ASSESSMENT AND PLAN: .    Brianna Turner is a 48 y.o. 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 Crisp metallic heart sound. Continue aspirin  and warfarin, follow-up with Coumadin  clinic. Scar is well-healed.  Minimal "gap" could be in the soft tissue, nondistended and sternotomy.  There is no wound dehiscence. Preoperatively, there was question whether she has hypertrophic cardiomyopathy.  This was excluded subsequently on further imaging. She is still on metoprolol  succinate 50 mg daily that was started preoperatively. Will check echocardiogram postop now.  If no suspicion for hypertrophic cardiomyopathy, metoprolol  can be changed to alternate antihypertensive therapy like amlodipine, as needed, in future. Encouraged regular walking up to 10,000 steps daily, which should help with weight loss.  F/u in 1 year  Signed, Cody Das, MD

## 2023-12-13 ENCOUNTER — Other Ambulatory Visit: Payer: Self-pay | Admitting: Medical Genetics

## 2023-12-22 ENCOUNTER — Ambulatory Visit: Payer: Commercial Managed Care - PPO | Attending: Internal Medicine

## 2023-12-22 ENCOUNTER — Other Ambulatory Visit (HOSPITAL_COMMUNITY): Payer: Self-pay

## 2023-12-22 DIAGNOSIS — Z952 Presence of prosthetic heart valve: Secondary | ICD-10-CM | POA: Diagnosis not present

## 2023-12-22 DIAGNOSIS — Z5181 Encounter for therapeutic drug level monitoring: Secondary | ICD-10-CM | POA: Diagnosis not present

## 2023-12-22 LAB — POCT INR: INR: 2.3 (ref 2.0–3.0)

## 2023-12-22 NOTE — Patient Instructions (Signed)
 continue taking 10mg  daily, except 7.5mg  on Monday, Tuesday and Friday. Recheck INR in 6 weeks. Call if any signs of abnormal bleeding. [phone]

## 2023-12-27 ENCOUNTER — Other Ambulatory Visit (HOSPITAL_COMMUNITY): Payer: Self-pay

## 2023-12-27 ENCOUNTER — Ambulatory Visit (HOSPITAL_COMMUNITY): Payer: Commercial Managed Care - PPO | Attending: Cardiology

## 2023-12-27 ENCOUNTER — Encounter: Payer: Self-pay | Admitting: Cardiology

## 2023-12-27 DIAGNOSIS — Z952 Presence of prosthetic heart valve: Secondary | ICD-10-CM | POA: Diagnosis present

## 2023-12-27 LAB — ECHOCARDIOGRAM COMPLETE
AR max vel: 2.14 cm2
AV Area VTI: 2.3 cm2
AV Area mean vel: 2.17 cm2
AV Mean grad: 9.5 mmHg
AV Peak grad: 16.6 mmHg
Ao pk vel: 2.04 m/s
Area-P 1/2: 3.77 cm2
S' Lateral: 2.5 cm

## 2024-02-01 ENCOUNTER — Other Ambulatory Visit: Payer: Self-pay | Admitting: Cardiology

## 2024-02-01 DIAGNOSIS — R55 Syncope and collapse: Secondary | ICD-10-CM

## 2024-02-01 DIAGNOSIS — I421 Obstructive hypertrophic cardiomyopathy: Secondary | ICD-10-CM

## 2024-02-02 ENCOUNTER — Ambulatory Visit: Payer: Commercial Managed Care - PPO | Attending: Cardiology

## 2024-02-02 DIAGNOSIS — Z5181 Encounter for therapeutic drug level monitoring: Secondary | ICD-10-CM

## 2024-02-02 DIAGNOSIS — Z952 Presence of prosthetic heart valve: Secondary | ICD-10-CM

## 2024-02-02 LAB — POCT INR: INR: 1.8 — AB (ref 2.0–3.0)

## 2024-02-02 NOTE — Patient Instructions (Signed)
 Take 2 tablets today only then continue taking 10mg  daily, except 7.5mg  on Monday, Tuesday and Friday. Recheck INR in 6 weeks. Call if any signs of abnormal bleeding. [phone]

## 2024-03-15 ENCOUNTER — Ambulatory Visit: Attending: Cardiology | Admitting: *Deleted

## 2024-03-15 DIAGNOSIS — Z5181 Encounter for therapeutic drug level monitoring: Secondary | ICD-10-CM

## 2024-03-15 DIAGNOSIS — Z952 Presence of prosthetic heart valve: Secondary | ICD-10-CM

## 2024-03-15 LAB — POCT INR: INR: 2.5 (ref 2.0–3.0)

## 2024-03-15 NOTE — Patient Instructions (Signed)
 Description   Continue taking 10mg  daily, except 7.5mg  on Monday, Tuesday and Friday. Recheck INR in 6 weeks. Call if any signs of abnormal bleeding. [phone]

## 2024-04-14 ENCOUNTER — Encounter: Payer: Self-pay | Admitting: Cardiology

## 2024-04-15 ENCOUNTER — Other Ambulatory Visit: Payer: Self-pay

## 2024-04-15 DIAGNOSIS — Z952 Presence of prosthetic heart valve: Secondary | ICD-10-CM

## 2024-04-15 MED ORDER — WARFARIN SODIUM 5 MG PO TABS: ORAL_TABLET | ORAL | 1 refills | Status: DC

## 2024-05-03 ENCOUNTER — Ambulatory Visit: Attending: Cardiology

## 2024-05-03 DIAGNOSIS — Z5181 Encounter for therapeutic drug level monitoring: Secondary | ICD-10-CM | POA: Diagnosis not present

## 2024-05-03 DIAGNOSIS — Z952 Presence of prosthetic heart valve: Secondary | ICD-10-CM | POA: Diagnosis not present

## 2024-05-03 LAB — POCT INR: INR: 2 (ref 2.0–3.0)

## 2024-05-03 NOTE — Progress Notes (Signed)
Please see anticoagulation encounter.

## 2024-05-03 NOTE — Patient Instructions (Signed)
 Description   Continue taking 10mg  daily, except 7.5mg  on Monday, Tuesday and Friday. Recheck INR in 6 weeks. Call if any signs of abnormal bleeding. [phone]

## 2024-06-14 ENCOUNTER — Ambulatory Visit: Attending: Cardiology

## 2024-06-14 DIAGNOSIS — Z952 Presence of prosthetic heart valve: Secondary | ICD-10-CM

## 2024-06-14 LAB — POCT INR: INR: 2.9 (ref 2.0–3.0)

## 2024-06-14 NOTE — Progress Notes (Signed)
 Description   iNR 2.9: Continue taking 10mg  daily, except 7.5mg  on Monday, Tuesday and Friday. Recheck INR in 7 weeks. Call if any signs of abnormal bleeding. [phone]

## 2024-06-14 NOTE — Patient Instructions (Addendum)
 Description   iNR 2.9: Continue taking 10mg  daily, except 7.5mg  on Monday, Tuesday and Friday. Recheck INR in 7 weeks. Call if any signs of abnormal bleeding. [phone]

## 2024-08-02 ENCOUNTER — Ambulatory Visit: Attending: Cardiology | Admitting: Pharmacist

## 2024-08-02 DIAGNOSIS — Z5181 Encounter for therapeutic drug level monitoring: Secondary | ICD-10-CM | POA: Diagnosis not present

## 2024-08-02 DIAGNOSIS — Z952 Presence of prosthetic heart valve: Secondary | ICD-10-CM | POA: Diagnosis not present

## 2024-08-02 LAB — POCT INR: INR: 1.4 — AB (ref 2.0–3.0)

## 2024-08-02 NOTE — Patient Instructions (Signed)
 Description   iNR 1.4:  Take 2 tablets today and then continue taking 10mg  daily, except 7.5mg  on Monday, Tuesday and Friday. Recheck INR in  3 weeks. Call if any signs of abnormal bleeding. [phone]

## 2024-08-02 NOTE — Progress Notes (Signed)
 Description   iNR 1.4:  Take 2 tablets today and then continue taking 10mg  daily, except 7.5mg  on Monday, Tuesday and Friday. Recheck INR in  3 weeks. Call if any signs of abnormal bleeding. [phone]

## 2024-08-20 ENCOUNTER — Ambulatory Visit: Attending: Cardiology | Admitting: *Deleted

## 2024-08-20 DIAGNOSIS — Z5181 Encounter for therapeutic drug level monitoring: Secondary | ICD-10-CM | POA: Diagnosis not present

## 2024-08-20 DIAGNOSIS — Z952 Presence of prosthetic heart valve: Secondary | ICD-10-CM

## 2024-08-20 LAB — POCT INR: INR: 1.9 — AB (ref 2.0–3.0)

## 2024-08-20 NOTE — Patient Instructions (Addendum)
 Description   INR- 1.9; Take 10mg  (2 tablets) today and then STAR taking 10mg  daily, except 7.5mg  on Monday and Friday. Recheck INR in 3 weeks-in [place]. Call if any signs of abnormal bleeding. [phone]

## 2024-08-20 NOTE — Progress Notes (Signed)
 Description   INR- 1.9; Take 10mg  (2 tablets) today and then STAR taking 10mg  daily, except 7.5mg  on Monday and Friday. Recheck INR in 3 weeks-in [place]. Call if any signs of abnormal bleeding. [phone]

## 2024-08-26 ENCOUNTER — Other Ambulatory Visit: Payer: Self-pay | Admitting: Medical Genetics

## 2024-08-26 DIAGNOSIS — Z006 Encounter for examination for normal comparison and control in clinical research program: Secondary | ICD-10-CM

## 2024-09-10 ENCOUNTER — Ambulatory Visit: Attending: Cardiology

## 2024-09-10 DIAGNOSIS — Z952 Presence of prosthetic heart valve: Secondary | ICD-10-CM

## 2024-09-10 DIAGNOSIS — Z5181 Encounter for therapeutic drug level monitoring: Secondary | ICD-10-CM

## 2024-09-10 LAB — POCT INR: INR: 1.9 — AB (ref 2.0–3.0)

## 2024-09-10 NOTE — Patient Instructions (Addendum)
 Take (2.5 tablets) today and then continue taking 10mg  daily, except 7.5mg  on Monday and Friday. Recheck INR in 4 weeks-in [place]. Call if any signs of abnormal bleeding. [phone]

## 2024-09-11 ENCOUNTER — Ambulatory Visit

## 2024-10-08 ENCOUNTER — Ambulatory Visit

## 2024-10-15 ENCOUNTER — Ambulatory Visit: Attending: Cardiology

## 2024-10-15 DIAGNOSIS — Z5181 Encounter for therapeutic drug level monitoring: Secondary | ICD-10-CM

## 2024-10-15 DIAGNOSIS — Z952 Presence of prosthetic heart valve: Secondary | ICD-10-CM | POA: Diagnosis not present

## 2024-10-15 LAB — POCT INR: INR: 2.8 (ref 2.0–3.0)

## 2024-10-15 NOTE — Patient Instructions (Signed)
 continue taking 10mg  daily, except 7.5mg  on Monday and Friday. Recheck INR in 5 weeks-in [place]. Call if any signs of abnormal bleeding. [phone]

## 2024-10-23 ENCOUNTER — Other Ambulatory Visit: Payer: Self-pay | Admitting: Cardiology

## 2024-10-23 DIAGNOSIS — Z952 Presence of prosthetic heart valve: Secondary | ICD-10-CM

## 2024-10-23 NOTE — Telephone Encounter (Signed)
 Warfarin 5mg  refill Dx-S/P AVR with 21 mm [employer]. Jude [employer]ent mechanical valve  Last INR 10/15/24 Last OV 12/04/23

## 2024-11-19 ENCOUNTER — Ambulatory Visit

## 2024-11-26 ENCOUNTER — Ambulatory Visit

## 2024-11-26 DIAGNOSIS — Z5181 Encounter for therapeutic drug level monitoring: Secondary | ICD-10-CM

## 2024-11-26 DIAGNOSIS — Z952 Presence of prosthetic heart valve: Secondary | ICD-10-CM | POA: Diagnosis not present

## 2024-11-26 LAB — POCT INR: INR: 3 (ref 2.0–3.0)

## 2024-11-26 NOTE — Patient Instructions (Signed)
 continue taking 10mg  daily, except 7.5mg  on Monday and Friday. Recheck INR in 5 weeks-in [place]. Call if any signs of abnormal bleeding. [phone]

## 2024-12-31 ENCOUNTER — Ambulatory Visit
